# Patient Record
Sex: Male | Born: 1990 | Race: Black or African American | Hispanic: No | Marital: Single | Smoking: Never smoker
Health system: Southern US, Community
[De-identification: ages and names within clinical notes are randomized; demographics above are authoritative.]

## PROBLEM LIST (undated history)

## (undated) ENCOUNTER — Ambulatory Visit: Admission: EM | Source: Home / Self Care

## (undated) DIAGNOSIS — N4400 Torsion of testis, unspecified: Secondary | ICD-10-CM

## (undated) DIAGNOSIS — G43909 Migraine, unspecified, not intractable, without status migrainosus: Secondary | ICD-10-CM

## (undated) HISTORY — DX: Migraine, unspecified, not intractable, without status migrainosus: G43.909

## (undated) HISTORY — PX: WISDOM TOOTH EXTRACTION: SHX21

---
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
## 2014-12-12 ENCOUNTER — Ambulatory Visit: Payer: Self-pay | Admitting: Internal Medicine

## 2015-01-20 ENCOUNTER — Ambulatory Visit (INDEPENDENT_AMBULATORY_CARE_PROVIDER_SITE_OTHER): Payer: 59 | Admitting: Internal Medicine

## 2015-01-20 ENCOUNTER — Encounter: Payer: Self-pay | Admitting: Internal Medicine

## 2015-01-20 ENCOUNTER — Other Ambulatory Visit (INDEPENDENT_AMBULATORY_CARE_PROVIDER_SITE_OTHER): Payer: 59

## 2015-01-20 VITALS — BP 122/70 | HR 86 | Temp 98.3°F | Resp 16 | Ht 69.0 in | Wt 149.8 lb

## 2015-01-20 DIAGNOSIS — Z0000 Encounter for general adult medical examination without abnormal findings: Secondary | ICD-10-CM | POA: Diagnosis not present

## 2015-01-20 LAB — CBC
HCT: 42.8 % (ref 39.0–52.0)
Hemoglobin: 14.6 g/dL (ref 13.0–17.0)
MCHC: 34.2 g/dL (ref 30.0–36.0)
MCV: 86.2 fl (ref 78.0–100.0)
Platelets: 229 10*3/uL (ref 150.0–400.0)
RBC: 4.96 Mil/uL (ref 4.22–5.81)
RDW: 13.4 % (ref 11.5–15.5)
WBC: 3.8 10*3/uL — ABNORMAL LOW (ref 4.0–10.5)

## 2015-01-20 LAB — BASIC METABOLIC PANEL
BUN: 10 mg/dL (ref 6–23)
CO2: 33 mEq/L — ABNORMAL HIGH (ref 19–32)
Calcium: 10 mg/dL (ref 8.4–10.5)
Chloride: 101 mEq/L (ref 96–112)
Creatinine, Ser: 1.02 mg/dL (ref 0.40–1.50)
GFR: 118.07 mL/min (ref >60.00)
Glucose, Bld: 72 mg/dL (ref 70–99)
Potassium: 3.7 mEq/L (ref 3.5–5.1)
Sodium: 138 mEq/L (ref 135–145)

## 2015-01-20 LAB — TSH: TSH: 2.13 u[IU]/mL (ref 0.35–4.50)

## 2015-01-20 NOTE — Progress Notes (Signed)
Pre visit review using our clinic review tool, if applicable. No additional management support is needed unless otherwise documented below in the visit note. 

## 2015-01-20 NOTE — Patient Instructions (Signed)
We will check on the blood work today to make sure that there are no problems.   Keep up the good work at being healthy and remember to exercise 3 times a week.   Be sure to use protection every time to decrease your risk of STDs. Great job staying away from tobacco keep up the good work with that.   Come back in 1-2 years for a check up and if you have any problems or questions please feel free to call the office.   Health Maintenance - 49-22 Years Old SCHOOL PERFORMANCE After high school, you may attend college or technical or vocational school, enroll in the [employer], or enter the workforce. PHYSICAL, SOCIAL, AND EMOTIONAL DEVELOPMENT  One hour of regular physical activity daily is recommended. Continue to participate in sports.  Develop your own interests and consider community service or volunteerism.  Make decisions about college and work plans.  Throughout these years, you should assume responsibility for your own health care. Increasing independence is important for you.  You may be exploring your sexual identity. Understand that you should never be in a situation that makes you feel uncomfortable, and tell your partner if you do not want to engage in sexual activity.  Body image may become important to you. Be mindful that eating disorders can develop at this time. Talk to your parents or other caregivers if you have concerns about body image, weight gain, or losing weight.  You may notice mood disturbances, depression, anxiety, attention problems, or trouble with alcohol. Talk to your health care provider if you have concerns about mental illness.  Set limits for yourself and talk with your parents or other caregivers about independent decision making.  Handle conflict without physical violence.  Avoid loud noises which may impair hearing.  Limit television and computer time to 2 hours each day. Individuals who engage in excessive inactivity are more likely to become  overweight. RECOMMENDED IMMUNIZATIONS  Influenza vaccine.  All adults should be immunized every year.  All adults, including pregnant women and people with hives-only allergy to eggs, can receive the inactivated influenza (IIV) vaccine.  Adults aged 18-49 years can receive the recombinant influenza (RIV) vaccine. The RIV vaccine does not contain any egg protein.  Tetanus, diphtheria, and acellular pertussis (Td, Tdap) vaccine.  Pregnant women should receive 1 dose of Tdap vaccine during each pregnancy. The dose should be obtained regardless of the length of time since the last dose. Immunization is preferred during the 27th to 36th week of gestation.  An adult who has not previously received Tdap or who does not know his or her vaccine status should receive 1 dose of Tdap. This initial dose should be followed by tetanus and diphtheria toxoids (Td) booster doses every 10 years.  Adults with an unknown or incomplete history of completing a 3-dose immunization series with Td-containing vaccines should begin or complete a primary immunization series including a Tdap dose.  Adults should receive a Td booster every 10 years.  Varicella vaccine.  An adult without evidence of immunity to varicella should receive 2 doses or a second dose if he or she has previously received 1 dose.  Pregnant females who do not have evidence of immunity should receive the first dose after pregnancy. This first dose should be obtained before leaving the health care facility. The second dose should be obtained 4-8 weeks after the first dose.  Human papillomavirus (HPV) vaccine.  Females aged 13-26 years who have not received the vaccine  previously should obtain the 3-dose series.  The vaccine is not recommended for pregnant females. However, pregnancy testing is not needed before receiving a dose. If a male is found to be pregnant after receiving a dose, no treatment is needed. In that case, the remaining doses  should be delayed until after the pregnancy.  Males aged 19-21 years who have not received the vaccine previously should receive the 3-dose series. Males aged 22-26 years may be immunized.  Immunization is recommended through the age of 22 years for any male who has sex with males and did not get any or all doses earlier.  Immunization is recommended for any person with an immunocompromised condition through the age of 43 years if he or she did not get any or all doses earlier.  During the 3-dose series, the second dose should be obtained 4-8 weeks after the first dose. The third dose should be obtained 24 weeks after the first dose and 16 weeks after the second dose.  Measles, mumps, and rubella (MMR) vaccine.  Adults born in [birth year] or later should have 1 or more doses of MMR vaccine unless there is a contraindication to the vaccine or there is laboratory evidence of immunity to each of the three diseases.  A routine second dose of MMR vaccine should be obtained at least 28 days after the first dose for students attending postsecondary schools, health care workers, and international travelers.  For females of childbearing age, rubella immunity should be determined. If there is no evidence of immunity, females who are not pregnant should be vaccinated. If there is no evidence of immunity, females who are pregnant should delay immunization until after pregnancy.  Pneumococcal 13-valent conjugate (PCV13) vaccine.  When indicated, a person who is uncertain of his or her immunization history and has no record of immunization should receive the PCV13 vaccine.  An adult aged 61 years or older who has certain medical conditions and has not been previously immunized should receive 1 dose of PCV13 vaccine. This PCV13 should be followed with a dose of pneumococcal polysaccharide (PPSV23) vaccine. The PPSV23 vaccine dose should be obtained at least 8 weeks after the dose of PCV13 vaccine.  An adult aged  67 years or older who has certain medical conditions and previously received 1 or more doses of PPSV23 vaccine should receive 1 dose of PCV13. The PCV13 vaccine dose should be obtained 1 or more years after the last PPSV23 vaccine dose.  Pneumococcal polysaccharide (PPSV23) vaccine.  When PCV13 is also indicated, PCV13 should be obtained first.  An adult younger than age 58 years who has certain medical conditions should be immunized.  Any person who resides in a long-term care facility should be immunized.  An adult smoker should be immunized.  People with an immunocompromised condition and certain other conditions should receive both PCV13 and PPSV23 vaccines.  People with human immunodeficiency virus (HIV) infection should be immunized as soon as possible after diagnosis.  Immunization during chemotherapy or radiation therapy should be avoided.  Routine use of PPSV23 vaccine is not recommended for American Indians, [place] Natives, or people younger than 65 years unless there are medical conditions that require PPSV23 vaccine.  When indicated, people who have unknown immunization and have no record of immunization should receive PPSV23 vaccine.  One-time revaccination 5 years after the first dose of PPSV23 is recommended for people aged 19-64 years who have chronic kidney failure, nephrotic syndrome, asplenia, or immunocompromised conditions.  Meningococcal vaccine.  Adults with  asplenia or persistent complement component deficiencies should receive 2 doses of quadrivalent meningococcal conjugate (MenACWY-D) vaccine. The doses should be obtained at least 2 months apart.  Microbiologists working with certain meningococcal bacteria, [employer] recruits, people at risk during an outbreak, and people who travel to or live in countries with a high rate of meningitis should be immunized.  A first-year college student up through age 35 years who is living in a residence hall should receive a  dose if he or she did not receive a dose on or after his or her 16th birthday.  Adults who have certain high-risk conditions should receive one or more doses of vaccine.  Hepatitis A vaccine.  Adults who wish to be protected from this disease, have certain high-risk conditions, work with hepatitis A-infected animals, work in hepatitis A research labs, or travel to or work in countries with a high rate of hepatitis A should be immunized.  Adults who were previously unvaccinated and who anticipate close contact with an international adoptee during the first 60 days after arrival in the [place] from a country with a high rate of hepatitis A should be immunized.  Hepatitis B vaccine.  Adults who wish to be protected from this disease, have certain high-risk conditions, may be exposed to blood or other infectious body fluids, are household contacts or sex partners of hepatitis B positive people, are clients or workers in certain care facilities, or travel to or work in countries with a high rate of hepatitis B should be immunized.  Haemophilus influenzae type b (Hib) vaccine.  A previously unvaccinated person with asplenia or sickle cell disease or having a scheduled splenectomy should receive 1 dose of Hib vaccine.  Regardless of previous immunization, a recipient of a hematopoietic stem cell transplant should receive a 3-dose series 6-12 months after his or her successful transplant.  Hib vaccine is not recommended for adults with HIV infection. TESTING  Annual screening for vision and hearing problems is recommended. Vision should be screened at least once between 45-48 years of age.  You may be screened for anemia or tuberculosis.  You should have a blood test to check for high cholesterol.  You should be screened for alcohol and drug use.  If you are sexually active, you may be screened for sexually transmitted infections (STIs), pregnancy, or HIV. You should be screened for STIs  if:  Your sexual activity has changed since the last screening test, and you are at an increased risk for chlamydia or gonorrhea. Ask your health care provider if you are at risk.  If you are at an increased risk for hepatitis B, you should be screened for this virus. You are considered at high risk for hepatitis B if you:  Were born in a country where hepatitis B occurs often. Talk with your health care provider about which countries are considered high risk.  Have parents who were born in a high-risk country and have not received a shot to protect against hepatitis B (hepatitis B vaccine).  Have HIV or AIDS.  Use needles to inject street drugs.  Live with or have sex with someone who has hepatitis B.  Are a man who has sex with other men (MSM).  Get hemodialysis treatment.  Take certain medicines for conditions like cancer, organ transplantation, or autoimmune conditions. NUTRITION   You should:  Have three servings of low-fat milk and dairy products daily. If you do not drink milk or consume dairy products, you should eat calcium-enriched  foods, such as juice, bread, or cereal. Dark, leafy greens or canned fish are alternate sources of calcium.  Drink plenty of water. Fruit juice should be limited to 8-12 oz (240-360 mL) each day. Sugary beverages and sodas should be avoided.  Avoid eating foods high in fat, salt, or sugar, such as chips, candy, and cookies.  Avoid fast foods and limit eating out at restaurants.  Try not to skip meals, especially breakfast. You should eat a variety of vegetables, fruits, and lean meats.  Eat meals together as a family whenever possible. ORAL HEALTH Brush your teeth twice a day and floss at least once a day. You should have two dental exams a year.  SKIN CARE You should wear sunscreen when out in the sun. TALK TO SOMEONE ABOUT:  Precautions against pregnancy, contraception, and sexually transmitted infections.  Taking a prescription  medicine daily to prevent HIV infection if you are at risk of being infected with HIV. This is called preexposure prophylaxis (PrEP). You are at risk if you:  Are a male who has sex with other males (MSM).  Are heterosexual and sexually active with more than one partner.  Take drugs by injection.  Are sexually active with a partner who has HIV.  Whether you are at high risk of being infected with HIV. If you choose to begin PrEP, you should first be tested for HIV. You should then be tested every 3 months for as long as you are taking PrEP.  Drug, tobacco, and alcohol use among your friends or at friends' homes. Smoking tobacco or marijuana and taking drugs have health consequences and may impact your brain development.  Appropriate use of over-the-counter or prescription medicines.  Driving guidelines and riding with friends.  The risks of drinking and driving or boating. Call someone if you have been drinking or using drugs and need a ride. WHAT'S NEXT? Visit your pediatrician or family physician once a year. By young adulthood, you should transition from your pediatrician to a family physician or internal medicine specialist. If you are a male and are sexually active, you may want to begin annual physical exams with a gynecologist. Document Released: 12/29/2006 Document Revised: 10/08/2013 Document Reviewed: 01/18/2007 [facility] Patient Information 2015 [place], [place]. This information is not intended to replace advice given to you by your health care provider. Make sure you discuss any questions you have with your health care provider.

## 2015-01-22 DIAGNOSIS — Z0000 Encounter for general adult medical examination without abnormal findings: Secondary | ICD-10-CM | POA: Insufficient documentation

## 2015-01-22 NOTE — Assessment & Plan Note (Signed)
Routine screening labs, recently had STD screening so will not duplicate. Talked with him about the need for safe driving and limiting drinking. Talked to him about the dangers of binge drinking as well as drinking and driving. He is aware. He is a non-smoker. Talked with him about sun safety. He does have a small spot of darker skin on his lip and advised him that it is likely nothing but he wishes to go to dermatologist which is fine. No indication at this time for early colon cancer screening. Has had tetanus shot. Talked to him about the need for protection with sexual activity to prevent against STDs.

## 2015-01-22 NOTE — Progress Notes (Signed)
   Subjective:    Patient ID: Paul Beasley, male    DOB: Oct 24, 1990, 24 y.o.   MRN: [id number]  HPI The patient is a new 24 YO man coming in for wellness. Denies smoking, drinking alcohol frequently. He uses protection and has regular STD screening including recent HIV test. Tries to exercise 3-4 times per week. Denies medical problems or complaints.   [facility], social history reviewed and updated.   Review of Systems  Constitutional: Negative for fever, activity change, appetite change, fatigue and unexpected weight change.  HENT: Negative.   Eyes: Negative.   Respiratory: Negative for cough, chest tightness, shortness of breath and wheezing.   Cardiovascular: Negative for chest pain, palpitations and leg swelling.  Gastrointestinal: Negative for abdominal pain, diarrhea, constipation and abdominal distention.  Musculoskeletal: Negative.   Neurological: Negative.   Psychiatric/Behavioral: Negative.       Objective:   Physical Exam  Constitutional: He is oriented to person, place, and time. He appears well-developed and well-nourished.  HENT:  Head: Normocephalic and atraumatic.  Eyes: EOM are normal.  Neck: Normal range of motion.  Cardiovascular: Normal rate and regular rhythm.   Pulmonary/Chest: Effort normal and breath sounds normal. No respiratory distress. He has no wheezes. He has no rales.  Abdominal: Soft. Bowel sounds are normal. He exhibits no distension. There is no tenderness. There is no rebound.  Neurological: He is alert and oriented to person, place, and time. Coordination normal.  Skin: Skin is warm and dry.   Filed Vitals:   01/20/15 1508  BP: 122/70  Pulse: 86  Temp: 98.3 F (36.8 C)  TempSrc: Oral  Resp: 16  Height: 5\' 9"  (1.753 m)  Weight: 149 lb 12.8 oz (67.949 kg)  SpO2: 97%      Assessment & Plan:

## 2015-11-21 ENCOUNTER — Encounter: Payer: Self-pay | Admitting: Family Medicine

## 2015-11-21 ENCOUNTER — Ambulatory Visit (INDEPENDENT_AMBULATORY_CARE_PROVIDER_SITE_OTHER): Payer: BLUE CROSS/BLUE SHIELD | Admitting: Family Medicine

## 2015-11-21 VITALS — BP 130/100 | HR 91 | Temp 98.7°F | Ht 69.0 in | Wt 141.0 lb

## 2015-11-21 DIAGNOSIS — J069 Acute upper respiratory infection, unspecified: Secondary | ICD-10-CM | POA: Diagnosis not present

## 2015-11-21 MED ORDER — BENZONATATE 200 MG PO CAPS: 200.0000 mg | ORAL_CAPSULE | Freq: Three times a day (TID) | ORAL | Status: DC | PRN

## 2015-11-21 NOTE — Assessment & Plan Note (Signed)
Likely viral, not clinically typical for flu.  Well appearing.   Flu testing wouldn't change mgmt at this point, given duration and mild sx.  Okay for outpatient f/u. Tessalon prn cough.  See avs.   D/w pt he agrees.

## 2015-11-21 NOTE — Patient Instructions (Signed)
Use tessalon for the cough.  Drink plenty of fluids, take tylenol as needed, and gargle with warm salt water for your throat if needed.  This should gradually improve.  Take care.  Let us know if you have other concerns.

## 2015-11-21 NOTE — Progress Notes (Signed)
Pre visit review using our clinic review tool, if applicable. No additional management support is needed unless otherwise documented below in the visit note.  Sx started about 4 days ago.  ST initially.  Still with ST.  Some nausea.   Some cough, chest sore from coughing, more cough recently.  Some sputum, clear. Fevers prev, better with tylenol.  No ear pain, some rhinorrhea.  Some cold sweats.  Sick contacts noted, sister.  No vomiting, no diarrhea.  Some mild aches, no rash.  Didn't recall tmax on home check.  Didn't have a flu shot.    Meds, vitals, and allergies reviewed.   ROS: See HPI.  Otherwise, noncontributory.  GEN: nad, alert and oriented HEENT: mucous membranes moist, tm w/o erythema, nasal exam w/o erythema, clear discharge noted,  OP with cobblestoning NECK: supple w/o LA CV: rrr.   PULM: ctab, no inc wob EXT: no edema

## 2016-06-21 ENCOUNTER — Telehealth: Payer: Self-pay | Admitting: Internal Medicine

## 2016-06-21 ENCOUNTER — Encounter: Payer: Self-pay | Admitting: Internal Medicine

## 2016-06-21 ENCOUNTER — Ambulatory Visit (INDEPENDENT_AMBULATORY_CARE_PROVIDER_SITE_OTHER): Payer: BLUE CROSS/BLUE SHIELD | Admitting: Internal Medicine

## 2016-06-21 DIAGNOSIS — B360 Pityriasis versicolor: Secondary | ICD-10-CM

## 2016-06-21 DIAGNOSIS — N50812 Left testicular pain: Secondary | ICD-10-CM | POA: Diagnosis not present

## 2016-06-21 MED ORDER — TRAMADOL HCL 50 MG PO TABS: 50.0000 mg | ORAL_TABLET | Freq: Three times a day (TID) | ORAL | 0 refills | Status: DC | PRN

## 2016-06-21 MED ORDER — KETOCONAZOLE 200 MG PO TABS: 200.0000 mg | ORAL_TABLET | Freq: Every day | ORAL | 0 refills | Status: DC

## 2016-06-21 MED ORDER — IBUPROFEN 600 MG PO TABS
600.0000 mg | ORAL_TABLET | Freq: Four times a day (QID) | ORAL | 0 refills | Status: DC | PRN
Start: 2016-06-21 — End: 2016-07-05

## 2016-06-21 MED ORDER — KETOROLAC TROMETHAMINE 60 MG/2ML IM SOLN
60.0000 mg | Freq: Once | INTRAMUSCULAR | Status: AC
  Administered 2016-06-21: 60 mg via INTRAMUSCULAR

## 2016-06-21 MED ORDER — CIPROFLOXACIN HCL 500 MG PO TABS: 500.0000 mg | ORAL_TABLET | Freq: Two times a day (BID) | ORAL | 0 refills | Status: DC

## 2016-06-21 MED ORDER — CEFTRIAXONE SODIUM 1 G IJ SOLR
1.0000 g | Freq: Once | INTRAMUSCULAR | Status: AC
  Administered 2016-06-21: 1 g via INTRAMUSCULAR

## 2016-06-21 NOTE — Progress Notes (Signed)
Pre visit review using our clinic review tool, if applicable. No additional management support is needed unless otherwise documented below in the visit note. 

## 2016-06-21 NOTE — Assessment & Plan Note (Signed)
Epididimitis. R/o torsion Cipro Toradol IM Tramadol #12 Ibuprofen 600 mg prn US STAT

## 2016-06-21 NOTE — Addendum Note (Signed)
Addended by: Merrilyn PumaSIMMONS, Madiline Saffran N on: 06/21/2016 04:46 PM   Modules accepted: Orders

## 2016-06-21 NOTE — Assessment & Plan Note (Signed)
Ketoconazole po 

## 2016-06-21 NOTE — Telephone Encounter (Signed)
FYI - [facility] couldn't see him till Fri for his US and the hospital could get him in tomorrow.  Pt wanted the appointment on Friday

## 2016-06-21 NOTE — Addendum Note (Signed)
Addended by: Merrilyn PumaSIMMONS, Lenita Peregrina N on: 06/21/2016 02:22 PM   Modules accepted: Orders

## 2016-06-21 NOTE — Telephone Encounter (Signed)
Noted Pls tell the pt to go to ER if the pain is worse or if the testis "pulls up" Thx

## 2016-06-21 NOTE — Progress Notes (Signed)
Subjective:  Patient ID: Paul Beasley, male    DOB: 04-Mar-1991  Age: 25 y.o. MRN: [id number]  CC: Testicle Pain (since waking up this morning. The pain is radiating into lower abd. No known injury) and Emesis (this morning)   HPI Paul StandsWilliam Beasley presents for a L testicle pain that started this am - 7/10. It felt like he was kicked in the balls. There was no "pulling up of the testicle". He took Ibuprofen - it did not help. Not sexually active. Vomited twice this am C/o back skin rash x 1 year      Outpatient Medications Prior to Visit  Medication Sig Dispense Refill  . benzonatate (TESSALON) 200 MG capsule Take 1 capsule (200 mg total) by mouth 3 (three) times daily as needed. 30 capsule 1   No facility-administered medications prior to visit.     ROS Review of Systems  Constitutional: Positive for fever.  Genitourinary: Negative for decreased urine volume and dysuria.    Objective:  BP 132/78   Pulse 87   Temp 99 F (37.2 C) (Oral)   Wt 145 lb (65.8 kg)   SpO2 98%   BMI 21.41 kg/m   BP Readings from Last 3 Encounters:  06/21/16 132/78  11/21/15 (!) 130/100  01/20/15 122/70    Wt Readings from Last 3 Encounters:  06/21/16 145 lb (65.8 kg)  11/21/15 141 lb (64 kg)  01/20/15 149 lb 12.8 oz (67.9 kg)    Physical Exam  Constitutional: He is oriented to person, place, and time. He appears well-developed. No distress.  NAD  HENT:  Mouth/Throat: Oropharynx is clear and moist.  Eyes: Conjunctivae are normal. Pupils are equal, round, and reactive to light.  Neck: Normal range of motion. No JVD present. No thyromegaly present.  Cardiovascular: Normal rate, regular rhythm, normal heart sounds and intact distal pulses.  Exam reveals no gallop and no friction rub.   No murmur heard. Pulmonary/Chest: Effort normal and breath sounds normal. No respiratory distress. He has no wheezes. He has no rales. He exhibits no tenderness.  Abdominal: Soft. Bowel sounds are normal.  He exhibits no distension and no mass. There is no tenderness. There is no rebound and no guarding.  Musculoskeletal: Normal range of motion. He exhibits no edema or tenderness.  Lymphadenopathy:    He has no cervical adenopathy.  Neurological: He is alert and oriented to person, place, and time. He has normal reflexes. No cranial nerve deficit. He exhibits normal muscle tone. He displays a negative Romberg sign. Coordination and gait normal.  Skin: Skin is warm and dry. No rash noted.  Psychiatric: He has a normal mood and affect. His behavior is normal. Judgment and thought content normal.  L epididymis is very tender and swollen; no torsion present Skin depigm on the back - macular  Lab Results  Component Value Date   WBC 3.8 (L) 01/20/2015   HGB 14.6 01/20/2015   HCT 42.8 01/20/2015   PLT 229.0 01/20/2015   GLUCOSE 72 01/20/2015   NA 138 01/20/2015   K 3.7 01/20/2015   CL 101 01/20/2015   CREATININE 1.02 01/20/2015   BUN 10 01/20/2015   CO2 33 (H) 01/20/2015   TSH 2.13 01/20/2015    Dg Nasal Bones  Result Date: 03/09/2000 FINDINGS CLINICAL DATA:     NOSE PAIN. NASAL BONES: NO EVIDENCE OF FRACTURE.  SUBOPTIMAL AERATION OF THE MAXILLARY SINUSES. IMPRESSION NO FRACTURE.   Assessment & Plan:   There are no diagnoses linked to  this encounter. I am having Mr. Rotan maintain his benzonatate.  No orders of the defined types were placed in this encounter.    Follow-up: No Follow-up on file.  Sonda Primes, MD

## 2016-06-21 NOTE — Patient Instructions (Addendum)
Epididymitis Epididymitis is swelling (inflammation) of the epididymis. The epididymis is a cord-like structure that is located along the top and back part of the testicle. It collects and stores sperm from the testicle. This condition can also cause pain and swelling of the testicle and scrotum. Symptoms usually start suddenly (acute epididymitis). Sometimes epididymitis starts gradually and lasts for a while (chronic epididymitis). This type may be harder to treat. CAUSES In men 59 and younger, this condition is usually caused by a bacterial infection or sexually transmitted disease (STD), such as:  Gonorrhea.  Chlamydia.  In men 31 and older who do not have anal sex, this condition is usually caused by bacteria from a blockage or abnormalities in the urinary system. These can result from:  Having a tube placed into the bladder (urinary catheter).  Having an enlarged or inflamed prostate gland.  Having recent urinary tract surgery. In men who have a condition that weakens the body's defense system (immune system), such as HIV, this condition can be caused by:   Other bacteria, including tuberculosis and syphilis.  Viruses.  Fungi. Sometimes this condition occurs without infection. That may happen if urine flows backward into the epididymis after heavy lifting or straining. RISK FACTORS This condition is more likely to develop in men:  Who have unprotected sex with more than one partner.  Who have anal sex.   Who have recently had surgery.   Who have a urinary catheter.  Who have urinary problems.  Who have a suppressed immune system. SYMPTOMS  This condition usually begins suddenly with chills, fever, and pain behind the scrotum and in the testicle. Other symptoms include:   Swelling of the scrotum, testicle, or both.  Pain whenejaculatingor urinating.  Pain in the back or belly.  Nausea.  Itching and discharge from the penis.  Frequent need to pass  urine.  Redness and tenderness of the scrotum. DIAGNOSIS Your health care provider can diagnose this condition based on your symptoms and medical history. Your health care provider will also do a physical exam to ask about your symptoms and check your scrotum and testicle for swelling, pain, and redness. You may also have other tests, including:   Examination of discharge from the penis.  Urine tests for infections, such as STDs.  Your health care provider may test you for other STDs, including HIV. TREATMENT Treatment for this condition depends on the cause. If your condition is caused by a bacterial infection, oral antibiotic medicine may be prescribed. If the bacterial infection has spread to your blood, you may need to receive IV antibiotics. Nonbacterial epididymitis is treated with home care that includes bed rest and elevation of the scrotum. Surgery may be needed to treat:  Bacterial epididymitis that causes pus to build up in the scrotum (abscess).  Chronic epididymitis that has not responded to other treatments. HOME CARE INSTRUCTIONS Medicines  Take over-the-counter and prescription medicines only as told by your health care provider.   If you were prescribed an antibiotic medicine, take it as told by your health care provider. Do not stop taking the antibiotic even if your condition improves. Sexual Activity  If your epididymitis was caused by an STD, avoid sexual activity until your treatment is complete.  Inform your sexual partner or partners if you test positive for an STD. They may need to be treated.Do not engage in sexual activity with your partner or partners until their treatment is completed. General Instructions  Return to your normal activities as told  by your health care provider. Ask your health care provider what activities are safe for you.  Keep your scrotum elevated and supported while resting. Ask your health care provider if you should wear a  scrotal support, such as a jockstrap. Wear it as told by your health care provider.  If directed, apply ice to the affected area:   Put ice in a plastic bag.  Place a towel between your skin and the bag.  Leave the ice on for 20 minutes, 2-3 times per day.  Try taking a sitz bath to help with discomfort. This is a warm water bath that is taken while you are sitting down. The water should only come up to your hips and should cover your buttocks. Do this 3-4 times per day or as told by your health care provider.  Keep all follow-up visits as told by your health care provider. This is important. SEEK MEDICAL CARE IF:   You have a fever.   Your pain medicine is not helping.   Your pain is getting worse.   Your symptoms do not improve within three days.   This information is not intended to replace advice given to you by your health care provider. Make sure you discuss any questions you have with your health care provider.   Document Released: 09/30/2000 Document Revised: 06/24/2015 Document Reviewed: 02/18/2015 Elsevier Interactive Patient Education 2016 [employer]   Go to ER if worse

## 2016-06-22 NOTE — Telephone Encounter (Signed)
Tried to call pt. Unable to leave msg. Will try again later

## 2016-06-22 NOTE — Telephone Encounter (Signed)
Spoke to pt and he is aware.

## 2016-06-24 ENCOUNTER — Emergency Department (HOSPITAL_COMMUNITY): Payer: BLUE CROSS/BLUE SHIELD | Admitting: Anesthesiology

## 2016-06-24 ENCOUNTER — Emergency Department (HOSPITAL_COMMUNITY)
Admission: EM | Admit: 2016-06-24 | Discharge: 2016-06-24 | Disposition: A | Discharge status: Home / Self Care | Payer: BLUE CROSS/BLUE SHIELD | Attending: Emergency Medicine | Admitting: Emergency Medicine

## 2016-06-24 ENCOUNTER — Encounter (HOSPITAL_COMMUNITY)
Admission: EM | Disposition: A | Discharge status: Home / Self Care | Payer: Self-pay | Source: Home / Self Care | Attending: Emergency Medicine

## 2016-06-24 ENCOUNTER — Ambulatory Visit
Admission: RE | Admit: 2016-06-24 | Discharge: 2016-06-24 | Disposition: A | Discharge status: Home / Self Care | Payer: BLUE CROSS/BLUE SHIELD | Source: Ambulatory Visit | Attending: Internal Medicine | Admitting: Internal Medicine

## 2016-06-24 ENCOUNTER — Encounter (HOSPITAL_COMMUNITY): Payer: Self-pay | Admitting: Emergency Medicine

## 2016-06-24 DIAGNOSIS — N501 Vascular disorders of male genital organs: Secondary | ICD-10-CM | POA: Insufficient documentation

## 2016-06-24 DIAGNOSIS — R938 Abnormal findings on diagnostic imaging of other specified body structures: Secondary | ICD-10-CM | POA: Diagnosis present

## 2016-06-24 DIAGNOSIS — Q554 Other congenital malformations of vas deferens, epididymis, seminal vesicles and prostate: Secondary | ICD-10-CM | POA: Diagnosis not present

## 2016-06-24 DIAGNOSIS — N50812 Left testicular pain: Secondary | ICD-10-CM

## 2016-06-24 DIAGNOSIS — Q5529 Other congenital malformations of testis and scrotum: Secondary | ICD-10-CM | POA: Insufficient documentation

## 2016-06-24 DIAGNOSIS — N451 Epididymitis: Secondary | ICD-10-CM

## 2016-06-24 DIAGNOSIS — N4400 Torsion of testis, unspecified: Secondary | ICD-10-CM

## 2016-06-24 HISTORY — PX: ORCHIECTOMY: SHX2116

## 2016-06-24 LAB — CBC WITH DIFFERENTIAL/PLATELET
Basophils Absolute: 0 10*3/uL (ref 0.0–0.1)
Basophils Relative: 0 %
Eosinophils Absolute: 0 10*3/uL (ref 0.0–0.7)
Eosinophils Relative: 1 %
HCT: 39.9 % (ref 39.0–52.0)
Hemoglobin: 13.3 g/dL (ref 13.0–17.0)
Lymphocytes Relative: 21 %
Lymphs Abs: 1.5 10*3/uL (ref 0.7–4.0)
MCH: 29.2 pg (ref 26.0–34.0)
MCHC: 33.3 g/dL (ref 30.0–36.0)
MCV: 87.5 fL (ref 78.0–100.0)
Monocytes Absolute: 0.6 10*3/uL (ref 0.1–1.0)
Monocytes Relative: 8 %
Neutro Abs: 4.8 10*3/uL (ref 1.7–7.7)
Neutrophils Relative %: 70 %
Platelets: 182 10*3/uL (ref 150–400)
RBC: 4.56 MIL/uL (ref 4.22–5.81)
RDW: 12.6 % (ref 11.5–15.5)
WBC: 6.8 10*3/uL (ref 4.0–10.5)

## 2016-06-24 LAB — BASIC METABOLIC PANEL
Anion gap: 9 (ref 5–15)
BUN: 16 mg/dL (ref 6–20)
CO2: 28 mmol/L (ref 22–32)
Calcium: 9.5 mg/dL (ref 8.9–10.3)
Chloride: 102 mmol/L (ref 101–111)
Creatinine, Ser: 1.08 mg/dL (ref 0.61–1.24)
GFR calc Af Amer: >60 mL/min (ref >60)
GFR calc non Af Amer: >60 mL/min (ref >60)
Glucose, Bld: 98 mg/dL (ref 65–99)
Potassium: 3.6 mmol/L (ref 3.5–5.1)
Sodium: 139 mmol/L (ref 135–145)

## 2016-06-24 SURGERY — ORCHIECTOMY
Anesthesia: General | Laterality: Bilateral

## 2016-06-24 MED ORDER — MIDAZOLAM HCL 2 MG/2ML IJ SOLN
INTRAMUSCULAR | Status: AC
  Filled 2016-06-24: qty 2

## 2016-06-24 MED ORDER — LACTATED RINGERS IV SOLN
INTRAVENOUS | Status: DC | PRN
  Administered 2016-06-24: 20:00:00 via INTRAVENOUS

## 2016-06-24 MED ORDER — KETOROLAC TROMETHAMINE 30 MG/ML IJ SOLN: 30.0000 mg | Freq: Once | INTRAMUSCULAR | Status: DC | PRN

## 2016-06-24 MED ORDER — CEFAZOLIN SODIUM-DEXTROSE 2-4 GM/100ML-% IV SOLN
INTRAVENOUS | Status: AC
  Filled 2016-06-24: qty 100

## 2016-06-24 MED ORDER — LIDOCAINE 2% (20 MG/ML) 5 ML SYRINGE
INTRAMUSCULAR | Status: AC
Start: 2016-06-24 — End: 2016-06-24
  Filled 2016-06-24: qty 5

## 2016-06-24 MED ORDER — BUPIVACAINE HCL (PF) 0.25 % IJ SOLN
INTRAMUSCULAR | Status: AC
Start: 2016-06-24 — End: 2016-06-24
  Filled 2016-06-24: qty 30

## 2016-06-24 MED ORDER — LACTATED RINGERS IV SOLN: INTRAVENOUS | Status: DC

## 2016-06-24 MED ORDER — DEXAMETHASONE SODIUM PHOSPHATE 10 MG/ML IJ SOLN
INTRAMUSCULAR | Status: DC | PRN
  Administered 2016-06-24: 10 mg via INTRAVENOUS

## 2016-06-24 MED ORDER — PROMETHAZINE HCL 25 MG/ML IJ SOLN: 6.2500 mg | INTRAMUSCULAR | Status: DC | PRN

## 2016-06-24 MED ORDER — LIDOCAINE 2% (20 MG/ML) 5 ML SYRINGE
INTRAMUSCULAR | Status: DC | PRN
  Administered 2016-06-24: 60 mg via INTRAVENOUS

## 2016-06-24 MED ORDER — DEXAMETHASONE SODIUM PHOSPHATE 10 MG/ML IJ SOLN
INTRAMUSCULAR | Status: AC
  Filled 2016-06-24: qty 1

## 2016-06-24 MED ORDER — SODIUM CHLORIDE 0.9 % IV BOLUS (SEPSIS)
1000.0000 mL | Freq: Once | INTRAVENOUS | Status: AC
Start: 2016-06-24 — End: 2016-06-24
  Administered 2016-06-24: 1000 mL via INTRAVENOUS

## 2016-06-24 MED ORDER — FENTANYL CITRATE (PF) 100 MCG/2ML IJ SOLN
INTRAMUSCULAR | Status: DC | PRN
  Administered 2016-06-24 (×2): 50 ug via INTRAVENOUS

## 2016-06-24 MED ORDER — ONDANSETRON HCL 4 MG/2ML IJ SOLN
INTRAMUSCULAR | Status: AC
  Filled 2016-06-24: qty 2

## 2016-06-24 MED ORDER — PROPOFOL 10 MG/ML IV BOLUS
INTRAVENOUS | Status: DC | PRN
  Administered 2016-06-24: 180 mg via INTRAVENOUS

## 2016-06-24 MED ORDER — PROPOFOL 10 MG/ML IV BOLUS
INTRAVENOUS | Status: AC
  Filled 2016-06-24: qty 20

## 2016-06-24 MED ORDER — OXYCODONE HCL 5 MG/5ML PO SOLN
5.0000 mg | Freq: Once | ORAL | Status: DC | PRN
  Filled 2016-06-24: qty 5

## 2016-06-24 MED ORDER — MIDAZOLAM HCL 5 MG/5ML IJ SOLN
INTRAMUSCULAR | Status: DC | PRN
  Administered 2016-06-24: 2 mg via INTRAVENOUS

## 2016-06-24 MED ORDER — CEFAZOLIN SODIUM-DEXTROSE 2-4 GM/100ML-% IV SOLN
2.0000 g | INTRAVENOUS | Status: AC
  Administered 2016-06-24: 2 g via INTRAVENOUS
  Filled 2016-06-24: qty 100

## 2016-06-24 MED ORDER — CEFAZOLIN (ANCEF) 1 G IV SOLR: 2.0000 g | INTRAVENOUS | Status: DC

## 2016-06-24 MED ORDER — ONDANSETRON HCL 4 MG/2ML IJ SOLN
INTRAMUSCULAR | Status: DC | PRN
  Administered 2016-06-24: 4 mg via INTRAVENOUS

## 2016-06-24 MED ORDER — BUPIVACAINE HCL (PF) 0.25 % IJ SOLN
INTRAMUSCULAR | Status: DC | PRN
Start: 2016-06-24 — End: 2016-06-24
  Administered 2016-06-24: 8 mL/h

## 2016-06-24 MED ORDER — OXYCODONE HCL 5 MG PO TABS: 5.0000 mg | ORAL_TABLET | Freq: Once | ORAL | Status: DC | PRN

## 2016-06-24 MED ORDER — FENTANYL CITRATE (PF) 100 MCG/2ML IJ SOLN
INTRAMUSCULAR | Status: AC
  Filled 2016-06-24: qty 2

## 2016-06-24 MED ORDER — FENTANYL CITRATE (PF) 100 MCG/2ML IJ SOLN: 25.0000 ug | INTRAMUSCULAR | Status: DC | PRN

## 2016-06-24 SURGICAL SUPPLY — 28 items
BLADE HEX COATED 2.75 (ELECTRODE) IMPLANT
BNDG GAUZE ELAST 4 BULKY (GAUZE/BANDAGES/DRESSINGS) ×2 IMPLANT
COVER SURGICAL LIGHT HANDLE (MISCELLANEOUS) IMPLANT
DRAIN PENROSE 18X1/4 LTX STRL (WOUND CARE) ×2 IMPLANT
DRAPE LAPAROTOMY T 98X78 PEDS (DRAPES) ×2 IMPLANT
ELECT REM PT RETURN 9FT ADLT (ELECTROSURGICAL) ×2
ELECTRODE REM PT RTRN 9FT ADLT (ELECTROSURGICAL) ×1 IMPLANT
GLOVE BIOGEL M 8.0 STRL (GLOVE) ×8 IMPLANT
GOWN STRL REUS W/ TWL XL LVL3 (GOWN DISPOSABLE) ×2 IMPLANT
GOWN STRL REUS W/TWL XL LVL3 (GOWN DISPOSABLE) ×4 IMPLANT
KIT BASIN OR (CUSTOM PROCEDURE TRAY) ×2 IMPLANT
LIQUID BAND (GAUZE/BANDAGES/DRESSINGS) ×2 IMPLANT
NEEDLE HYPO 22GX1.5 SAFETY (NEEDLE) ×2 IMPLANT
NS IRRIG 1000ML POUR BTL (IV SOLUTION) ×2 IMPLANT
PACK GENERAL/GYN (CUSTOM PROCEDURE TRAY) ×2 IMPLANT
SUPPORT SCROTAL LG STRP (MISCELLANEOUS) ×2 IMPLANT
SUT CHROMIC 3 0 SH 27 (SUTURE) ×2 IMPLANT
SUT CHROMIC 4 0 SH 27 (SUTURE) IMPLANT
SUT MNCRL AB 4-0 PS2 18 (SUTURE) ×2 IMPLANT
SUT PROLENE 4 0 RB 1 (SUTURE) ×1
SUT PROLENE 4-0 RB1 .5 CRCL 36 (SUTURE) ×1 IMPLANT
SUT SILK 2 0 (SUTURE) ×1
SUT SILK 2-0 30XBRD TIE 12 (SUTURE) ×1 IMPLANT
SUT VIC AB 2-0 UR5 27 (SUTURE) IMPLANT
SUT VICRYL 0 TIES 12 18 (SUTURE) IMPLANT
SYR CONTROL 10ML LL (SYRINGE) IMPLANT
TOWEL OR 17X26 10 PK STRL BLUE (TOWEL DISPOSABLE) ×2 IMPLANT
WATER STERILE IRR 1500ML POUR (IV SOLUTION) ×2 IMPLANT

## 2016-06-24 NOTE — Transfer of Care (Signed)
Immediate Anesthesia Transfer of Care Note  Patient: Paul Beasley  Procedure(s) Performed: Procedure(s): SCROTAL EXPLORATION, RIGHT ORCHIOPEXY, LEFT ORCHIECTOMY (Bilateral)  Patient Location: PACU  Anesthesia Type:General  Level of Consciousness: awake, alert  and oriented  Airway & Oxygen Therapy: Patient Spontanous Breathing and Patient connected to face mask oxygen  Post-op Assessment: Report given to RN and Post -op Vital signs reviewed and stable  Post vital signs: Reviewed and stable  Last Vitals:  Vitals:   06/24/16 1705 06/24/16 1816  BP: 144/99 150/84  Pulse: 83 85  Resp: 16 16  Temp: 37.1 C 36.9 C    Last Pain:  Vitals:   06/24/16 1816  TempSrc: Oral  PainSc:          Complications: No apparent anesthesia complications

## 2016-06-24 NOTE — Anesthesia Preprocedure Evaluation (Signed)
Anesthesia Evaluation  Patient identified by MRN, date of birth, ID band Patient awake    Reviewed: Allergy & Precautions, NPO status , Patient's Chart, lab work & pertinent test results  Airway Mallampati: II  TM Distance: >3 FB Neck ROM: Full    Dental no notable dental hx.    Pulmonary neg pulmonary ROS,    Pulmonary exam normal breath sounds clear to auscultation       Cardiovascular negative cardio ROS Normal cardiovascular exam Rhythm:Regular Rate:Normal     Neuro/Psych negative neurological ROS  negative psych ROS   GI/Hepatic negative GI ROS, Neg liver ROS,   Endo/Other  negative endocrine ROS  Renal/GU negative Renal ROS  negative genitourinary   Musculoskeletal negative musculoskeletal ROS (+)   Abdominal   Peds negative pediatric ROS (+)  Hematology negative hematology ROS (+)   Anesthesia Other Findings   Reproductive/Obstetrics negative OB ROS                             Anesthesia Physical Anesthesia Plan  ASA: I and emergent  Anesthesia Plan: General   Post-op Pain Management:    Induction: Intravenous  Airway Management Planned: LMA and Oral ETT  Additional Equipment:   Intra-op Plan:   Post-operative Plan: Extubation in OR  Informed Consent: I have reviewed the patients History and Physical, chart, labs and discussed the procedure including the risks, benefits and alternatives for the proposed anesthesia with the patient or authorized representative who has indicated his/her understanding and acceptance.   Dental advisory given  Plan Discussed with: CRNA and Surgeon  Anesthesia Plan Comments:         Anesthesia Quick Evaluation

## 2016-06-24 NOTE — Discharge Instructions (Signed)
HOME CARE INSTRUCTIONS FOR SCROTAL PROCEDURES  Wound Care & Hygiene: You may apply an ice bag to the scrotum for the first 24 hours.  This may help decrease swelling and soreness.  You may have a dressing held in place by an athletic supporter.  You may remove the dressing in 24 hours and shower in 48 hours.  Continue to use the athletic supporter or tight briefs for at least a week.  It may be more comfortable to wear a washcloth or gauze underneath of your undershorts/athletic supporter.  This may add extra support. Activity: Rest today - not necessarily flat bed rest.  Just take it easy.  You should not do strenuous activities until your follow-up visit with your doctor.  You may resume light activity in 48 hours.  Return to Work:  Take off work for a week-it is okay to return on September 11.    Diet: Drink liquids or eat a light diet this evening.  You may resume a regular diet tomorrow.  General Expectations: You may have a small amount of bleeding.  The scrotum may be swollen or bruised for about a week.  Call your Doctor if these occur:  -persistent or heavy bleeding  -temperature of 101 degrees or more  -severe pain, not relieved by your pain medication   Post Anesthesia Home Care Instructions  Activity: Get plenty of rest for the remainder of the day. A responsible adult should stay with you for 24 hours following the procedure.  For the next 24 hours, DO NOT: -Drive a car -Advertising copywriterperate machinery -Drink alcoholic beverages -Take any medication unless instructed by your physician -Make any legal decisions or sign important papers.  Meals: Start with liquid foods such as gelatin or soup. Progress to regular foods as tolerated. Avoid greasy, spicy, heavy foods. If nausea and/or vomiting occur, drink only clear liquids until the nausea and/or vomiting subsides. Call your physician if vomiting continues.  Special Instructions/Symptoms: Your throat may feel dry or sore from  the anesthesia or the breathing tube placed in your throat during surgery. If this causes discomfort, gargle with warm salt water. The discomfort should disappear within 24 hours.  If you had a scopolamine patch placed behind your ear for the management of post- operative nausea and/or vomiting:  1. The medication in the patch is effective for 72 hours, after which it should be removed.  Wrap patch in a tissue and discard in the trash. Wash hands thoroughly with soap and water. 2. You may remove the patch earlier than 72 hours if you experience unpleasant side effects which may include dry mouth, dizziness or visual disturbances. 3. Avoid touching the patch. Wash your hands with soap and water after contact with the patch.

## 2016-06-24 NOTE — Anesthesia Procedure Notes (Signed)
Procedure Name: LMA Insertion Date/Time: 06/24/2016 8:10 PM Performed by: Enriqueta ShutterWILLIFORD, Maleya Leever D Pre-anesthesia Checklist: Patient identified, Emergency Drugs available, Suction available and Patient being monitored Patient Re-evaluated:Patient Re-evaluated prior to inductionOxygen Delivery Method: Circle system utilized Preoxygenation: Pre-oxygenation with 100% oxygen Intubation Type: IV induction Ventilation: Mask ventilation without difficulty LMA Size: 4.0 Tube type: Oral Number of attempts: 1 Placement Confirmation: positive ETCO2 and breath sounds checked- equal and bilateral Tube secured with: Tape Dental Injury: Teeth and Oropharynx as per pre-operative assessment

## 2016-06-24 NOTE — ED Notes (Signed)
Bed: [id number] Expected date:  Expected time:  Means of arrival:  Comments: Abnormal [place]

## 2016-06-24 NOTE — ED Triage Notes (Signed)
Patient states that Tuesday woke up with left lower sided/testicular pain. Patient went for ultrasound today and has no parterial flow to left side and needs urology consult.

## 2016-06-24 NOTE — ED Provider Notes (Signed)
Medical screening examination/treatment/procedure(s) were conducted as a shared visit with non-physician practitioner(s) and myself.  I personally evaluated the patient during the encounter.   EKG Interpretation None      25 year old male who presents with left groin pain. Started on Tuesday w/o trauma or exertional activity. Seen in his PCP's office and diagnosed with potential epididymitis. Started on antibiotics and scheduled for US scrotum which was performed today. US concerning for torsion and he was sent to ED. No n/v, fever, chills, dysuria, or frequency of urine. Abdomen benign. US reviewed, he has no arterial flow to left testicle and there is scrotal edema. Dr. Hillis Rangeahlstadt to consult and see. Will take to OR.   Paul Guiseana Duo Thurlow Gallaga, MD 06/24/16 [phone]

## 2016-06-24 NOTE — ED Notes (Signed)
Due to questions in consent info, informed consent to be performed in the [place]G wip[place] completed, all valuables given to mother Roma SchanzLatonya Nicholson

## 2016-06-24 NOTE — ED Provider Notes (Signed)
[facility]-EMERGENCY DEPT Provider Note   CSN: [id number] Arrival date & time: 06/24/16  1651     History   Chief Complaint Chief Complaint  Patient presents with  . abnormal ultrasound  . urology consult  . Testicle Pain    HPI  Blood pressure 144/99, pulse 83, temperature 98.8 F (37.1 C), temperature source Oral, resp. rate 16, height 5' 9.25" (1.759 m), weight 65.8 kg, SpO2 100 %.  Paul Beasley is a 25 y.o. male complaining of testicular pain, bilateral started 4 days ago, pain and lateralized to the left side, no trauma or inciting events, no dysuria, hematuria, urethral discharge. Swelling strted over the last 24 hours.  He saw his primary care doctor who ordered an ultrasound which showed no arterial flow to the left testicle, was advised to come to the ED. States his pain is moderate and exacerbated by movement and palpation, he had ibuprofen this morning with some relief, he last drank water just prior to arrival. No other past medical history.  HPI  Past Medical History:  Diagnosis Date  . Migraine     Patient Active Problem List   Diagnosis Date Noted  . Testicular pain, left 06/21/2016  . Tinea versicolor 06/21/2016  . URI (upper respiratory infection) 11/21/2015  . Routine general medical examination at a health care facility 01/22/2015    Past Surgical History:  Procedure Laterality Date  . WISDOM TOOTH EXTRACTION         Home Medications    Prior to Admission medications   Medication Sig Start Date End Date Taking? Authorizing Provider  ciprofloxacin (CIPRO) 500 MG tablet Take 1 tablet (500 mg total) by mouth 2 (two) times daily. 06/21/16  Yes Georgina Quint Plotnikov, MD  ibuprofen (ADVIL,MOTRIN) 600 MG tablet Take 1 tablet (600 mg total) by mouth every 6 (six) hours as needed for moderate pain. 06/21/16  Yes Georgina Quint Plotnikov, MD  traMADol (ULTRAM) 50 MG tablet Take 1 tablet (50 mg total) by mouth every 8 (eight) hours as needed. 06/21/16  Yes Georgina Quint  Plotnikov, MD  ketoconazole (NIZORAL) 200 MG tablet Take 1 tablet (200 mg total) by mouth daily. 06/21/16   Tresa Garter, MD    Family History Family History  Problem Relation Age of Onset  . Hypertension Mother   . Hypertension Paternal Grandmother   . Hypertension Paternal Grandfather     Social History Social History  Substance Use Topics  . Smoking status: Never Smoker  . Smokeless tobacco: Never Used  . Alcohol use No     Allergies   Review of patient's allergies indicates no known allergies.   Review of Systems Review of Systems  10 systems reviewed and found to be negative, except as noted in the HPI.  Physical Exam Updated Vital Signs BP 150/84 (BP Location: Right Arm)   Pulse 85   Temp 98.5 F (36.9 C) (Oral)   Resp 16   Ht 5' 9.25" (1.759 m)   Wt 65.8 kg   SpO2 96%   BMI 21.26 kg/m   Physical Exam  Constitutional: He is oriented to person, place, and time. He appears well-developed and well-nourished. No distress.  HENT:  Head: Normocephalic and atraumatic.  Mouth/Throat: Oropharynx is clear and moist.  Eyes: Conjunctivae and EOM are normal. Pupils are equal, round, and reactive to light.  Neck: Normal range of motion.  Cardiovascular: Normal rate, regular rhythm and intact distal pulses.   Pulmonary/Chest: Effort normal and breath sounds normal.  Abdominal: Soft.  There is no tenderness.  Genitourinary:  Genitourinary Comments: Left testicle is diffusely edematous and tender to palpation. Prehn sign negative.  Musculoskeletal: Normal range of motion.  Neurological: He is alert and oriented to person, place, and time.  Skin: He is not diaphoretic.  Psychiatric: He has a normal mood and affect.  Nursing note and vitals reviewed.    ED Treatments / Results  Labs  CRITICAL CARE Performed by: Wynetta Emery   Total critical care time: 35 minutes  Critical care time was exclusive of separately billable procedures and treating other  patients.  Critical care was necessary to treat or prevent imminent or life-threatening deterioration.  Critical care was time spent personally by me on the following activities: development of treatment plan with patient and/or surrogate as well as nursing, discussions with consultants, evaluation of patient's response to treatment, examination of patient, obtaining history from patient or surrogate, ordering and performing treatments and interventions, ordering and review of laboratory studies, ordering and review of radiographic studies, pulse oximetry and re-evaluation of patient's condition.  (all labs ordered are listed, but only abnormal results are displayed) Labs Reviewed  CBC WITH DIFFERENTIAL/PLATELET  BASIC METABOLIC PANEL    EKG  EKG Interpretation None       Radiology US Scrotum  Result Date: 06/24/2016 CLINICAL DATA:  Acute left testicular pain. EXAM: SCROTAL ULTRASOUND DOPPLER ULTRASOUND OF THE TESTICLES TECHNIQUE: Complete ultrasound examination of the testicles, epididymis, and other scrotal structures was performed. Color and spectral Doppler ultrasound were also utilized to evaluate blood flow to the testicles. COMPARISON:  None. FINDINGS: Right testicle Measurements: 4.9 x 2.9 x 2.5 cm. No mass or microlithiasis visualized. Left testicle Measurements: 4.6 x 3.4 x 2.9 cm. The testicle is heterogeneous in appearance. Decreased venous flow is noted on Doppler. No definite arterial flow is noted. Right epididymis:  Normal in size and appearance. Left epididymis: Enlarged with increased flow suggesting epididymitis. Hydrocele:  Minimal left hydrocele is noted. Varicocele:  None visualized. Pulsed Doppler interrogation of both testes demonstrates normal low resistance arterial and venous waveforms of the right testicle. Decreased venous flow is noted in the left, with no definite arterial flow seen in left testicle. Scrotal wall thickening is noted. IMPRESSION: Left testicle  appears to be hypoechoic and heterogeneous in appearance, suggesting possible edema, with no definite arterial flow noted on Doppler. This is concerning for testicular torsion. Left epididymis appears to be enlarged also suggesting possible epididymitis. Critical Value/emergent results were called by telephone at the time of interpretation on 06/24/2016 at 3:49 pm to Dr. Jacinta Shoe , who verbally acknowledged these results. Electronically Signed   By: Lupita Raider, M.D.   On: 06/24/2016 15:50   [place] Art/ven Flow Abd Pelv Doppler  Result Date: 06/24/2016 CLINICAL DATA:  Acute left testicular pain. EXAM: SCROTAL ULTRASOUND DOPPLER ULTRASOUND OF THE TESTICLES TECHNIQUE: Complete ultrasound examination of the testicles, epididymis, and other scrotal structures was performed. Color and spectral Doppler ultrasound were also utilized to evaluate blood flow to the testicles. COMPARISON:  None. FINDINGS: Right testicle Measurements: 4.9 x 2.9 x 2.5 cm. No mass or microlithiasis visualized. Left testicle Measurements: 4.6 x 3.4 x 2.9 cm. The testicle is heterogeneous in appearance. Decreased venous flow is noted on Doppler. No definite arterial flow is noted. Right epididymis:  Normal in size and appearance. Left epididymis: Enlarged with increased flow suggesting epididymitis. Hydrocele:  Minimal left hydrocele is noted. Varicocele:  None visualized. Pulsed Doppler interrogation of both testes demonstrates normal low resistance  arterial and venous waveforms of the right testicle. Decreased venous flow is noted in the left, with no definite arterial flow seen in left testicle. Scrotal wall thickening is noted. IMPRESSION: Left testicle appears to be hypoechoic and heterogeneous in appearance, suggesting possible edema, with no definite arterial flow noted on Doppler. This is concerning for testicular torsion. Left epididymis appears to be enlarged also suggesting possible epididymitis. Critical Value/emergent results  were called by telephone at the time of interpretation on 06/24/2016 at 3:49 pm to Dr. Jacinta ShoeALEKSEI PLOTNIKOV , who verbally acknowledged these results. Electronically Signed   By: Lupita RaiderJames  Green Jr, M.D.   On: 06/24/2016 15:50    Procedures Procedures (including critical care time)  Medications Ordered in ED Medications  sodium chloride 0.9 % bolus 1,000 mL (1,000 mLs Intravenous New Bag/Given 06/24/16 1727)     Initial Impression / Assessment and Plan / ED Course  I have reviewed the triage vital signs and the nursing notes.  Pertinent labs & imaging results that were available during my care of the patient were reviewed by me and considered in my medical decision making (see chart for details).  Clinical Course    Vitals:   06/24/16 1701 06/24/16 1705 06/24/16 1816  BP:  144/99 150/84  Pulse:  83 85  Resp:  16 16  Temp:  98.8 F (37.1 C) 98.5 F (36.9 C)  TempSrc:  Oral Oral  SpO2:  100% 96%  Weight: 65.8 kg    Height: 5' 9.25" (1.759 m)      Medications  sodium chloride 0.9 % bolus 1,000 mL (1,000 mLs Intravenous New Bag/Given 06/24/16 1727)    Lunette StandsWilliam Manzano is 25 y.o. male presenting with Testicular pain onset several days ago with associated swelling onset yesterday. Ultrasound shows a left testicular epididymitis with no arterial flow to the left testes. Likely torsed recently. Case discussed with urologist Dr. Archie Balboaahlstet we'll taken to the operating room. Patient advised to remain nothing by mouth.     Final Clinical Impressions(s) / ED Diagnoses   Final diagnoses:  Testicular torsion  Epididymitis     Wynetta Emeryicole Leeta Grimme, PA-C 06/24/16 1911    Lavera Guiseana Duo Liu, MD 06/24/16 1940

## 2016-06-24 NOTE — Op Note (Signed)
Preoperative diagnosis: Delayed left testicular torsion.  Postoperative diagnosis: Same  Principal procedure: Scrotal exploration, left orchiectomy, right orchidopexy.  Surgeon: Retta Dionesahlstedt  Anesthesia: Gen.  Complications: None.  Estimated blood loss: Less than 50 mL  Specimen: Left testicle, to pathology  Findings: The left testicle was without torsion, but was gangrenous.  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  This was initially treated as epididymitis.  Patient had persistent pain and underwent a scrotal ultrasound today revealing no blood flow in his left testicle, findings strongly suspicious for torsion.  He presents at this time for scrotal exploration, possible orchiectomy on the left versus orchidopexy, right orchidopexy.  The procedure as well as risks and complications have been discussed with the patient and his family.  The understand and desire to proceed.  Description of procedure: The patient was properly identified and marked in the holding area.  He received preoperative IV Ancef.  He was taken the operating room where general anesthetic was administered.Marland Kitchen.  He is placed in the dorsolithotomy position.  Genitalia and perineum were prepped and draped.  Proper timeout was performed.  A midline incision was carried out in the median raphae of the anterior scrotum.  There was significant scrotal wall edema.  It was carried down to the left tunica albuginea.  It was obvious, through the tunica albuginea, that the testicle had been infarcted.  Once the tunica albuginea was incised, the testicle was delivered.  There was no obvious torsion of the cord.  However, the cord was thrombosed as well.  It was obvious that an orchiectomy needed to be carried out.  Kelly clamps were used to dissected cord into 2 bundles, with each bundle clamped.  The spermatic cord distal to the clamps was cut.  10 mL  of quarter percent Marcaine was then used to infiltrate the left spermatic cord.  The cord bundles were ligated each with 2 separate 0 silk ties.  After removal of the clamps, there was adequate hemostasis.  I reviewed the cord stump for some time, with no bleeding seen.  Small bleeders within the left hemiscrotum were coagulated.  At this point, dissection was carried down to the right tunica vaginalis using the electrocautery.  The tunica albuginea was then encountered.  Following incision of the tunica vaginalis.  There was both an appendix testis and appendix epididymis.  Both of these were cauterized.  The testicle and epididymal structures appeared normal.  4-0 Prolene was then used to fix the tunica albuginea to the dartos fascia in 3 separate areas.  This completed the orchidopexy.  I then closed the dartos fascia with a running 3-0 chromic.  Skin edges were then reapproximated with a running subcuticular suture of 4-0 Monocryl.  No drain was left.  Dermabond was placed over top of the incision.  Gauze sponges were placed underneath the compressive undergarment.  The testicle and cord were sent for pathology.  The patient tolerated procedure well.  He was taken to the PACU in stable condition.  Sponge needle and enhancement counts were correct 2.

## 2016-06-24 NOTE — Telephone Encounter (Signed)
Dr. Posey ReaPlotnikov received call report on STAT ultrasound done today.  See results in chart. He wants pt informed to [facility] now due to findings. I called [facility] at [phone]. I spoke to pt while he was still there. He states he can be there shortly after 5 pm.    I [facility] and advised pt will be there and needs to see Urologist today per PCP to r/o possible testicular torsion.

## 2016-06-24 NOTE — H&P (Signed)
Urology History and Physical Exam  CC: Left testicular pain  HPI: 25 year old male , college student [employer], presents with a three-day history of left testicular pain.  This was fairly sudden on onset, this Tuesday.  He went to his PCPs office where epididymitis was suspected.  He was started on antibiotics and pain medicine.  With his persistent pain, he underwent ultrasound of his testicles today.  This revealed a normal right testicle that no blood flow of his left testicle.  There is been significant swelling of his left hemiscrotum.  He has persistent pain.  There is a history of  occasional herald episodes of pain in his left testicle.  These were very short-lived.  He denies any right-sided testicular pain.  PMH: Past Medical History:  Diagnosis Date  . Migraine     PSH: Past Surgical History:  Procedure Laterality Date  . WISDOM TOOTH EXTRACTION      Allergies: No Known Allergies  Medications:  (Not in a hospital admission)   Social History: Social History   Social History  . Marital status: Single    Spouse name: N/A  . Number of children: N/A  . Years of education: N/A   Occupational History  . Not on file.   Social History Main Topics  . Smoking status: Never Smoker  . Smokeless tobacco: Never Used  . Alcohol use No  . Drug use: No  . Sexual activity: Not on file   Other Topics Concern  . Not on file   Social History Narrative  . No narrative on file    Family History: Family History  Problem Relation Age of Onset  . Hypertension Mother   . Hypertension Paternal Grandmother   . Hypertension Paternal Grandfather     Review of Systems: Positive: Left testicular pain, swelling Negative:   A further 10 point review of systems was negative except what is listed in the HPI.                  Physical Exam: @VITALS2 @ General: No acute distress.  Awake. Head:  Normocephalic.  Atraumatic. ENT:  EOMI.  Mucous  membranes moist Neck:  Supple.  No lymphadenopathy. CV:  S1 present. S2 present. Regular rate. Pulmonary: Equal effort bilaterally.  Clear to auscultation bilaterally. Abdomen: Soft.  Non- tender to palpation. Skin:  Normal turgor.  No visible rash. Extremity: No gross deformity of bilateral upper extremities.  No gross deformity of                             lower extremities. Neurologic: Alert. Appropriate mood.  Penis:  circumcised.  No lesions. Urethra: Orthotopic meatus. Scrotum: No lesions.  Significant left greater than right.  Scrotal edema Testicles: Descended bilaterally.  Left testicle is palpably enlarged and tender.  Right testicle is nontender.  Left testicle is high riding. .  Studies:  Recent Labs     06/24/16  1728  HGB  13.3  WBC  6.8  PLT  182    Recent Labs     06/24/16  1728  NA  139  K  3.6  CL  102  CO2  28  BUN  16  CREATININE  1.08  CALCIUM  9.5  GFRNONAA  >60  GFRAA  >60    Scrotal ultrasound images were reviewed personally and with the patient and his family.  There is no flow in the  left testicle.  Right testicle and epididymis appear normal.  There is edema of his skin No results for input(s): INR, APTT in the last 72 hours.  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  I discussed the fact that more than likely he has an infarcted, gangrenous left testicle.  Nonetheless, I think we should proceed urgently for scrotal exploration, possible left orchiectomy versus orchidopexy and right orchidopexy.  The patient has been on anti-inflammatories, so they realize that there might be an increased risk of hematoma postoperatively.  The patient and his parents agree with this strategy.  We will proceed tonight, upon anesthesia  consultation/clearance.

## 2016-06-26 NOTE — Anesthesia Postprocedure Evaluation (Signed)
Anesthesia Post Note  Patient: Paul Beasley  Procedure(s) Performed: Procedure(s) (LRB): SCROTAL EXPLORATION, RIGHT ORCHIOPEXY, LEFT ORCHIECTOMY (Bilateral)  Patient location during evaluation: PACU Anesthesia Type: General Level of consciousness: awake and alert Pain management: pain level controlled Vital Signs Assessment: post-procedure vital signs reviewed and stable Respiratory status: spontaneous breathing, nonlabored ventilation, respiratory function stable and patient connected to nasal cannula oxygen Cardiovascular status: blood pressure returned to baseline and stable Postop Assessment: no signs of nausea or vomiting Anesthetic complications: no    Last Vitals:  Vitals:   06/24/16 2200 06/24/16 2220  BP: 138/84 (!) 141/83  Pulse: 75 80  Resp: 16 16  Temp: 36.7 C 36.6 C    Last Pain:  Vitals:   06/24/16 2115  TempSrc:   PainSc: 0-No pain                 Daren Doswell S

## 2016-06-27 ENCOUNTER — Encounter (HOSPITAL_COMMUNITY): Payer: Self-pay | Admitting: Urology

## 2016-07-01 ENCOUNTER — Inpatient Hospital Stay (HOSPITAL_COMMUNITY)
Admission: EM | Admit: 2016-07-01 | Discharge: 2016-07-03 | DRG: 872 | Disposition: A | Discharge status: Home / Self Care | Payer: BLUE CROSS/BLUE SHIELD | Attending: Internal Medicine | Admitting: Internal Medicine

## 2016-07-01 ENCOUNTER — Emergency Department (HOSPITAL_COMMUNITY): Payer: BLUE CROSS/BLUE SHIELD

## 2016-07-01 DIAGNOSIS — N4400 Torsion of testis, unspecified: Secondary | ICD-10-CM | POA: Diagnosis not present

## 2016-07-01 DIAGNOSIS — Z79899 Other long term (current) drug therapy: Secondary | ICD-10-CM | POA: Diagnosis not present

## 2016-07-01 DIAGNOSIS — Z8249 Family history of ischemic heart disease and other diseases of the circulatory system: Secondary | ICD-10-CM | POA: Diagnosis not present

## 2016-07-01 DIAGNOSIS — E875 Hyperkalemia: Secondary | ICD-10-CM | POA: Diagnosis not present

## 2016-07-01 DIAGNOSIS — E872 Acidosis: Secondary | ICD-10-CM | POA: Diagnosis not present

## 2016-07-01 DIAGNOSIS — R000 Tachycardia, unspecified: Secondary | ICD-10-CM | POA: Diagnosis present

## 2016-07-01 DIAGNOSIS — N179 Acute kidney failure, unspecified: Secondary | ICD-10-CM | POA: Diagnosis present

## 2016-07-01 DIAGNOSIS — R740 Nonspecific elevation of levels of transaminase and lactic acid dehydrogenase [LDH]: Secondary | ICD-10-CM | POA: Diagnosis present

## 2016-07-01 DIAGNOSIS — A419 Sepsis, unspecified organism: Principal | ICD-10-CM | POA: Diagnosis present

## 2016-07-01 DIAGNOSIS — R509 Fever, unspecified: Secondary | ICD-10-CM | POA: Diagnosis present

## 2016-07-01 DIAGNOSIS — Z9079 Acquired absence of other genital organ(s): Secondary | ICD-10-CM

## 2016-07-01 DIAGNOSIS — R197 Diarrhea, unspecified: Secondary | ICD-10-CM | POA: Diagnosis not present

## 2016-07-01 DIAGNOSIS — Z9889 Other specified postprocedural states: Secondary | ICD-10-CM

## 2016-07-01 DIAGNOSIS — E860 Dehydration: Secondary | ICD-10-CM | POA: Diagnosis not present

## 2016-07-01 DIAGNOSIS — R0602 Shortness of breath: Secondary | ICD-10-CM | POA: Diagnosis not present

## 2016-07-01 DIAGNOSIS — R7989 Other specified abnormal findings of blood chemistry: Secondary | ICD-10-CM | POA: Diagnosis present

## 2016-07-01 HISTORY — DX: Torsion of testis, unspecified: N44.00

## 2016-07-01 LAB — CBC WITH DIFFERENTIAL/PLATELET
Basophils Absolute: 0 10*3/uL (ref 0.0–0.1)
Basophils Relative: 0 %
Eosinophils Absolute: 0 10*3/uL (ref 0.0–0.7)
Eosinophils Relative: 0 %
HCT: 38.5 % — ABNORMAL LOW (ref 39.0–52.0)
Hemoglobin: 13.4 g/dL (ref 13.0–17.0)
Lymphocytes Relative: 9 %
Lymphs Abs: 1.3 10*3/uL (ref 0.7–4.0)
MCH: 29.6 pg (ref 26.0–34.0)
MCHC: 34.8 g/dL (ref 30.0–36.0)
MCV: 85 fL (ref 78.0–100.0)
Monocytes Absolute: 1.4 10*3/uL — ABNORMAL HIGH (ref 0.1–1.0)
Monocytes Relative: 9 %
Neutro Abs: 12.5 10*3/uL — ABNORMAL HIGH (ref 1.7–7.7)
Neutrophils Relative %: 82 %
Platelets: 217 10*3/uL (ref 150–400)
RBC: 4.53 MIL/uL (ref 4.22–5.81)
RDW: 12.1 % (ref 11.5–15.5)
WBC: 15.2 10*3/uL — ABNORMAL HIGH (ref 4.0–10.5)

## 2016-07-01 LAB — BASIC METABOLIC PANEL
Anion gap: 12 (ref 5–15)
BUN: 20 mg/dL (ref 6–20)
CO2: 23 mmol/L (ref 22–32)
Calcium: 9.4 mg/dL (ref 8.9–10.3)
Chloride: 102 mmol/L (ref 101–111)
Creatinine, Ser: 1.75 mg/dL — ABNORMAL HIGH (ref 0.61–1.24)
GFR calc Af Amer: >60 mL/min (ref >60)
GFR calc non Af Amer: 54 mL/min — ABNORMAL LOW (ref >60)
Glucose, Bld: 128 mg/dL — ABNORMAL HIGH (ref 65–99)
Potassium: 3.4 mmol/L — ABNORMAL LOW (ref 3.5–5.1)
Sodium: 137 mmol/L (ref 135–145)

## 2016-07-01 LAB — I-STAT CG4 LACTIC ACID, ED: Lactic Acid, Venous: 2.23 mmol/L (ref 0.5–1.9)

## 2016-07-01 LAB — RAPID STREP SCREEN (MED CTR MEBANE ONLY): Streptococcus, Group A Screen (Direct): NEGATIVE

## 2016-07-01 MED ORDER — SODIUM CHLORIDE 0.9 % IV BOLUS (SEPSIS): 1000.0000 mL | Freq: Once | INTRAVENOUS | Status: DC

## 2016-07-01 MED ORDER — PIPERACILLIN-TAZOBACTAM 3.375 G IVPB 30 MIN
3.3750 g | Freq: Once | INTRAVENOUS | Status: AC
  Administered 2016-07-01: 3.375 g via INTRAVENOUS
  Filled 2016-07-01: qty 50

## 2016-07-01 MED ORDER — SODIUM CHLORIDE 0.9 % IV BOLUS (SEPSIS)
2000.0000 mL | Freq: Once | INTRAVENOUS | Status: AC
  Administered 2016-07-01: 2000 mL via INTRAVENOUS

## 2016-07-01 MED ORDER — ACETAMINOPHEN 325 MG PO TABS
650.0000 mg | ORAL_TABLET | Freq: Once | ORAL | Status: AC
  Administered 2016-07-01: 650 mg via ORAL
  Filled 2016-07-01: qty 2

## 2016-07-01 MED ORDER — VANCOMYCIN HCL IN DEXTROSE 1-5 GM/200ML-% IV SOLN
1000.0000 mg | Freq: Once | INTRAVENOUS | Status: AC
Start: 2016-07-01 — End: 2016-07-01
  Administered 2016-07-01: 1000 mg via INTRAVENOUS
  Filled 2016-07-01: qty 200

## 2016-07-01 NOTE — ED Triage Notes (Signed)
Pt presents with nausea/fever since this morning.  Diaphoretic.  Temp spiked to 103 at home.  [facility]  This morning awakened feeling unwell.  Treated at home with tramadol 50 and ibuprofen.

## 2016-07-01 NOTE — ED Notes (Signed)
Notified EDP,James,MD., pt. I-stat CG4 Lactic acid 2.23. And RN,Rick made aware.

## 2016-07-01 NOTE — ED Provider Notes (Signed)
[facility]-EMERGENCY DEPT Provider Note   CSN: [id number] Arrival date & time: 07/01/16  1958     History   Chief Complaint Chief Complaint  Patient presents with  . Fever    HPI Paul Beasley is a 25 y.o. male.  He presents for evaluation of fever, lightheadedness, or syncope, and hypotension.  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 He had 5 days of testicular pain last week. Unfortunate presented on day 5 here with an infarcted left testicle underwent orchiectomy. Was discharged same day, which was 6 days ago back home. His been convalescing well. Is been dizzy lightheaded last few days. Fever 103 this morning. Mom brings him in here. He presents with a pressure of 65 systolic.  HPI  Past Medical History:  Diagnosis Date  . Migraine   . Testicular torsion     Patient Active Problem List   Diagnosis Date Noted  . 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 Elevated lactic acid level 07/02/2016  . AKI (acute kidney injury) (HCC) 07/02/2016  . Sepsis (HCC) 07/02/2016  . Fever 07/02/2016  . Tinea versicolor 06/21/2016  . URI (upper respiratory infection) 11/21/2015  . Routine general medical examination at a health care facility 01/22/2015    Past Surgical History:  Procedure Laterality Date  . ORCHIECTOMY Bilateral 06/24/2016   Procedure: SCROTAL EXPLORATION, RIGHT ORCHIOPEXY, LEFT ORCHIECTOMY;  Surgeon: Marcine MatarStephen Dahlstedt, MD;  Location: [facility] ORS;  Service: Urology;  Laterality: Bilateral;  . WISDOM TOOTH EXTRACTION         Home Medications    Prior to Admission medications   Medication Sig Start Date End Date Taking? Authorizing Provider  ibuprofen (ADVIL,MOTRIN) 600 MG tablet Take 1 tablet (600 mg total) by mouth every 6 (six) hours as needed for moderate pain. 06/21/16  Yes Georgina QuintAleksei V Plotnikov, MD  ketoconazole (NIZORAL) 200 MG tablet Take 1 tablet (200 mg total) by mouth daily. 06/21/16  Yes Georgina QuintAleksei V Plotnikov, MD  traMADol (ULTRAM) 50 MG tablet Take 1 tablet (50 mg total) by  mouth every 8 (eight) hours as needed. 06/21/16  Yes Tresa GarterAleksei V Plotnikov, MD  saccharomyces boulardii (FLORASTOR) 250 MG capsule Take 1 capsule (250 mg total) by mouth 2 (two) times daily. 07/03/16   Marinda ElkAbraham Feliz Ortiz, MD  sulfamethoxazole-trimethoprim (BACTRIM DS,SEPTRA DS) 800-160 MG tablet Take 1 tablet by mouth every 12 (twelve) hours. 07/03/16   Marinda ElkAbraham Feliz Ortiz, MD    Family History Family History  Problem Relation Age of Onset  . Hypertension Mother   . Hypertension Paternal Grandmother   . Hypertension Paternal Grandfather     Social History Social History  Substance Use Topics  . Smoking status: Never Smoker  . Smokeless tobacco: Never Used  . Alcohol use No     Allergies   Review of patient's allergies indicates no known allergies.   Review of Systems Review of Systems  Constitutional: Positive for fatigue and fever. Negative for appetite change, chills and diaphoresis.  HENT: Negative for mouth sores, sore throat and trouble swallowing.        Denies sore throat. No swelling in the neck. No stiff neck.  Eyes: Negative for visual disturbance.  Respiratory: Negative for cough, chest tightness, shortness of breath and wheezing.   Cardiovascular: Negative for chest pain.  Gastrointestinal: Negative for abdominal distention, abdominal pain, diarrhea, nausea and vomiting.  Endocrine: Negative for polydipsia, polyphagia and polyuria.  Genitourinary: Negative for dysuria, frequency and hematuria.       Denies any significant discomfort  at his hemiscrotum. No drainage or bleeding from the wound.  Musculoskeletal: Negative for gait problem.  Skin: Negative for color change, pallor and rash.  Neurological: Positive for dizziness and weakness. Negative for syncope, light-headedness and headaches.  Hematological: Does not bruise/bleed easily.  Psychiatric/Behavioral: Negative for behavioral problems and confusion.     Physical Exam Updated Vital Signs BP (!) 142/75 (BP  Location: Right Arm)   Pulse 78   Temp 98.6 F (37 C) (Oral)   Resp 15   Ht 5' 9.25" (1.759 m)   Wt 148 lb 6.4 oz (67.3 kg)   SpO2 100%   BMI 21.76 kg/m   Physical Exam  Constitutional: He is oriented to person, place, and time. He appears well-developed and well-nourished. No distress.  HENT:  Head: Normocephalic.  Drink spine. Conjunctiva not pale.  Eyes: Conjunctivae are normal. Pupils are equal, round, and reactive to light. No scleral icterus.  Neck: Normal range of motion. Neck supple. No thyromegaly present.  Cardiovascular: Normal rate and regular rhythm.  Exam reveals no gallop and no friction rub.   No murmur heard. Pulmonary/Chest: Effort normal and breath sounds normal. No respiratory distress. He has no wheezes. He has no rales.  Abdominal: Soft. Bowel sounds are normal. He exhibits no distension. There is no tenderness. There is no rebound.  Genitourinary:  Genitourinary Comments: MG left hemiscrotum. Wound appears normal. No soft tissue swelling or drainage.  Musculoskeletal: Normal range of motion.  Neurological: He is alert and oriented to person, place, and time.  Skin: Skin is warm and dry. No rash noted.  Psychiatric: He has a normal mood and affect. His behavior is normal.     ED Treatments / Results  Labs (all labs ordered are listed, but only abnormal results are displayed) Labs Reviewed  CBC WITH DIFFERENTIAL/PLATELET - Abnormal; Notable for the following:       Result Value   WBC 15.2 (*)    HCT 38.5 (*)    Neutro Abs 12.5 (*)    Monocytes Absolute 1.4 (*)    All other components within normal limits  BASIC METABOLIC PANEL - Abnormal; Notable for the following:    Potassium 3.4 (*)    Glucose, Bld 128 (*)    Creatinine, Ser 1.75 (*)    GFR calc non Af Amer 54 (*)    All other components within normal limits  PROTIME-INR - Abnormal; Notable for the following:    Prothrombin Time 18.2 (*)    All other components within normal limits  BASIC  METABOLIC PANEL - Abnormal; Notable for the following:    Potassium 3.2 (*)    Calcium 8.4 (*)    All other components within normal limits  CBC - Abnormal; Notable for the following:    WBC 11.8 (*)    RBC 3.94 (*)    Hemoglobin 11.3 (*)    HCT 32.8 (*)    All other components within normal limits  I-STAT CG4 LACTIC ACID, ED - Abnormal; Notable for the following:    Lactic Acid, Venous 2.23 (*)    All other components within normal limits  RAPID STREP SCREEN (NOT AT [facility])  CULTURE, GROUP A STREP ([facility])  URINE CULTURE  CULTURE, BLOOD (ROUTINE X 2)  CULTURE, BLOOD (ROUTINE X 2)  URINALYSIS, ROUTINE W REFLEX MICROSCOPIC (NOT AT [facility])  LACTIC ACID, PLASMA  LACTIC ACID, PLASMA  PROCALCITONIN  APTT  INFLUENZA PANEL BY PCR (TYPE A & B, H1N1)  HIV ANTIBODY (ROUTINE TESTING)  EKG  EKG Interpretation  Date/Time:  Friday July 01 2016 20:14:44 EDT Ventricular Rate:  103 PR Interval:    QRS Duration: 99 QT Interval:  320 QTC Calculation: 419 R Axis:   84 Text Interpretation:  Sinus tachycardia Right atrial enlargement Consider right ventricular hypertrophy Borderline ST elevation, anterior leads RSR' or QR pattern in V1 suggests right ventricular conduction delay No old tracing to compare Confirmed by [facility]  MD, DAVID ([id number]) on 07/02/2016 12:21:21 AM       Radiology US Scrotum  Result Date: 07/02/2016 CLINICAL DATA:  Recent LEFT orchiectomy for LEFT testicular torsion. Fever EXAM: ULTRASOUND OF SCROTUM TECHNIQUE: Complete ultrasound examination of the testicles, epididymis, and other scrotal structures was performed. COMPARISON:  Ultrasound 06/24/2016 FINDINGS: Right testicle Measurements: Normal in size and homogeneous echotexture measuring 4.8 x 2.7 x 2.7 cm. Normal testicular striations present. Normal color Doppler flow. Left testicle Measurements:  Surgically absent. Right epididymis:  Normal in size and appearance. Left epididymis:  Surgically absent Hydrocele: Within  the LEFT hemiscrotum, there is a the fluid collection with thin internal septations. No significant hypervascularity or thickening. Collection measures 2.4 x 1.6 x 3.3 cm. Varicocele:  None visualized. IMPRESSION: 1. Septated fluid collection within the LEFT hemiscrotum following orchectomy without evidence of abscess formation. This is favored benign postsurgical finding. 2. Normal RIGHT testicle. Electronically Signed   By: Genevive Bi M.D.   On: 07/02/2016 09:55    Procedures Procedures (including critical care time)  Medications Ordered in ED Medications  acetaminophen (TYLENOL) tablet 650 mg (650 mg Oral Given 07/01/16 2052)  sodium chloride 0.9 % bolus 2,000 mL (0 mLs Intravenous Stopped 07/01/16 2228)  vancomycin (VANCOCIN) IVPB 1000 mg/200 mL premix (0 mg Intravenous Stopped 07/01/16 2356)  piperacillin-tazobactam (ZOSYN) IVPB 3.375 g (0 g Intravenous Stopped 07/01/16 2323)  potassium chloride SA (K-DUR,KLOR-CON) CR tablet 40 mEq (40 mEq Oral Given 07/02/16 2141)     Initial Impression / Assessment and Plan / ED Course  I have reviewed the triage vital signs and the nursing notes.  Pertinent labs & imaging results that were available during my care of the patient were reviewed by me and considered in my medical decision making (see chart for details).  Clinical Course  Value Comment By Time  Specific Gravity, Urine: 1.021 (Reviewed) Rolland Porter, MD 09/17 2352    Postoperative fever. They 6. No source on exam or evaluation. Normal chest negative strep. Elevated lactate. Mild history of present illness.  Final Clinical Impressions(s) / ED Diagnoses   Final diagnoses:  Febrile illness  Sepsis, due to unspecified organism [facility])    Discussed with hospitalist and with urology. Do not feel this is an obvious wound infection. They will see patient in consultation. One admitted to hospitalist for fluids and observation.   New Prescriptions Discharge Medication List as of  07/03/2016  5:07 PM    START taking these medications   Details  saccharomyces boulardii (FLORASTOR) 250 MG capsule Take 1 capsule (250 mg total) by mouth 2 (two) times daily., Starting Sun 07/03/2016, Normal    sulfamethoxazole-trimethoprim (BACTRIM DS,SEPTRA DS) 800-160 MG tablet Take 1 tablet by mouth every 12 (twelve) hours., Starting Sun 07/03/2016, Normal         Rolland Porter, MD 07/03/16 [phone]

## 2016-07-02 ENCOUNTER — Observation Stay (HOSPITAL_COMMUNITY): Payer: BLUE CROSS/BLUE SHIELD

## 2016-07-02 ENCOUNTER — Encounter (HOSPITAL_COMMUNITY): Payer: Self-pay | Admitting: Internal Medicine

## 2016-07-02 DIAGNOSIS — Z79899 Other long term (current) drug therapy: Secondary | ICD-10-CM | POA: Diagnosis not present

## 2016-07-02 DIAGNOSIS — R7989 Other specified abnormal findings of blood chemistry: Secondary | ICD-10-CM | POA: Diagnosis present

## 2016-07-02 DIAGNOSIS — Z9889 Other specified postprocedural states: Secondary | ICD-10-CM | POA: Diagnosis not present

## 2016-07-02 DIAGNOSIS — A419 Sepsis, unspecified organism: Secondary | ICD-10-CM | POA: Diagnosis not present

## 2016-07-02 DIAGNOSIS — R509 Fever, unspecified: Secondary | ICD-10-CM | POA: Diagnosis not present

## 2016-07-02 DIAGNOSIS — R000 Tachycardia, unspecified: Secondary | ICD-10-CM | POA: Diagnosis present

## 2016-07-02 DIAGNOSIS — Z9079 Acquired absence of other genital organ(s): Secondary | ICD-10-CM | POA: Diagnosis not present

## 2016-07-02 DIAGNOSIS — E875 Hyperkalemia: Secondary | ICD-10-CM | POA: Diagnosis present

## 2016-07-02 DIAGNOSIS — N179 Acute kidney failure, unspecified: Secondary | ICD-10-CM | POA: Diagnosis present

## 2016-07-02 DIAGNOSIS — E872 Acidosis: Secondary | ICD-10-CM | POA: Diagnosis not present

## 2016-07-02 DIAGNOSIS — R740 Nonspecific elevation of levels of transaminase and lactic acid dehydrogenase [LDH]: Secondary | ICD-10-CM | POA: Diagnosis present

## 2016-07-02 DIAGNOSIS — N4400 Torsion of testis, unspecified: Secondary | ICD-10-CM

## 2016-07-02 DIAGNOSIS — E860 Dehydration: Secondary | ICD-10-CM | POA: Diagnosis not present

## 2016-07-02 DIAGNOSIS — Z8249 Family history of ischemic heart disease and other diseases of the circulatory system: Secondary | ICD-10-CM | POA: Diagnosis not present

## 2016-07-02 DIAGNOSIS — R197 Diarrhea, unspecified: Secondary | ICD-10-CM | POA: Diagnosis not present

## 2016-07-02 LAB — URINALYSIS, ROUTINE W REFLEX MICROSCOPIC
Bilirubin Urine: NEGATIVE
Glucose, UA: NEGATIVE mg/dL
Hgb urine dipstick: NEGATIVE
Ketones, ur: NEGATIVE mg/dL
Leukocytes, UA: NEGATIVE
Nitrite: NEGATIVE
Protein, ur: NEGATIVE mg/dL
Specific Gravity, Urine: 1.021 (ref 1.005–1.030)
pH: 5 (ref 5.0–8.0)

## 2016-07-02 LAB — BASIC METABOLIC PANEL
Anion gap: 6 (ref 5–15)
BUN: 15 mg/dL (ref 6–20)
CO2: 25 mmol/L (ref 22–32)
Calcium: 8.4 mg/dL — ABNORMAL LOW (ref 8.9–10.3)
Chloride: 106 mmol/L (ref 101–111)
Creatinine, Ser: 1.22 mg/dL (ref 0.61–1.24)
GFR calc Af Amer: >60 mL/min (ref >60)
GFR calc non Af Amer: >60 mL/min (ref >60)
Glucose, Bld: 95 mg/dL (ref 65–99)
Potassium: 3.2 mmol/L — ABNORMAL LOW (ref 3.5–5.1)
Sodium: 137 mmol/L (ref 135–145)

## 2016-07-02 LAB — CBC
HCT: 32.8 % — ABNORMAL LOW (ref 39.0–52.0)
Hemoglobin: 11.3 g/dL — ABNORMAL LOW (ref 13.0–17.0)
MCH: 28.7 pg (ref 26.0–34.0)
MCHC: 34.5 g/dL (ref 30.0–36.0)
MCV: 83.2 fL (ref 78.0–100.0)
Platelets: 173 10*3/uL (ref 150–400)
RBC: 3.94 MIL/uL — ABNORMAL LOW (ref 4.22–5.81)
RDW: 12 % (ref 11.5–15.5)
WBC: 11.8 10*3/uL — ABNORMAL HIGH (ref 4.0–10.5)

## 2016-07-02 LAB — APTT: aPTT: 33 seconds (ref 24–36)

## 2016-07-02 LAB — HIV ANTIBODY (ROUTINE TESTING W REFLEX): HIV Screen 4th Generation wRfx: NONREACTIVE

## 2016-07-02 LAB — INFLUENZA PANEL BY PCR (TYPE A & B)
H1N1 flu by pcr: NOT DETECTED
Influenza A By PCR: NEGATIVE
Influenza B By PCR: NEGATIVE

## 2016-07-02 LAB — PROCALCITONIN: Procalcitonin: 1.81 ng/mL

## 2016-07-02 LAB — LACTIC ACID, PLASMA
Lactic Acid, Venous: 1.2 mmol/L (ref 0.5–1.9)
Lactic Acid, Venous: 0.7 mmol/L (ref 0.5–1.9)

## 2016-07-02 LAB — PROTIME-INR
INR: 1.49
Prothrombin Time: 18.2 seconds — ABNORMAL HIGH (ref 11.4–15.2)

## 2016-07-02 MED ORDER — ACETAMINOPHEN 650 MG RE SUPP: 650.0000 mg | Freq: Four times a day (QID) | RECTAL | Status: DC | PRN

## 2016-07-02 MED ORDER — IBUPROFEN 200 MG PO TABS
600.0000 mg | ORAL_TABLET | Freq: Four times a day (QID) | ORAL | Status: DC | PRN
  Administered 2016-07-02: 600 mg via ORAL
  Filled 2016-07-02 (×2): qty 1

## 2016-07-02 MED ORDER — ACETAMINOPHEN 325 MG PO TABS
650.0000 mg | ORAL_TABLET | Freq: Four times a day (QID) | ORAL | Status: DC | PRN
  Administered 2016-07-03: 650 mg via ORAL
  Filled 2016-07-02 (×2): qty 2

## 2016-07-02 MED ORDER — ONDANSETRON HCL 4 MG/2ML IJ SOLN: 4.0000 mg | Freq: Four times a day (QID) | INTRAMUSCULAR | Status: DC | PRN

## 2016-07-02 MED ORDER — VANCOMYCIN HCL IN DEXTROSE 750-5 MG/150ML-% IV SOLN
750.0000 mg | Freq: Two times a day (BID) | INTRAVENOUS | Status: DC
  Filled 2016-07-02: qty 150

## 2016-07-02 MED ORDER — ONDANSETRON HCL 4 MG PO TABS: 4.0000 mg | ORAL_TABLET | Freq: Four times a day (QID) | ORAL | Status: DC | PRN

## 2016-07-02 MED ORDER — SODIUM CHLORIDE 0.9 % IV SOLN: INTRAVENOUS | Status: DC

## 2016-07-02 MED ORDER — LACTATED RINGERS IV SOLN
INTRAVENOUS | Status: DC
  Administered 2016-07-02 (×2): via INTRAVENOUS

## 2016-07-02 MED ORDER — POTASSIUM CHLORIDE CRYS ER 20 MEQ PO TBCR
40.0000 meq | EXTENDED_RELEASE_TABLET | Freq: Two times a day (BID) | ORAL | Status: AC
  Administered 2016-07-02 (×2): 40 meq via ORAL
  Filled 2016-07-02 (×2): qty 2

## 2016-07-02 MED ORDER — VANCOMYCIN HCL IN DEXTROSE 750-5 MG/150ML-% IV SOLN
750.0000 mg | Freq: Three times a day (TID) | INTRAVENOUS | Status: DC
  Administered 2016-07-02 – 2016-07-03 (×3): 750 mg via INTRAVENOUS
  Filled 2016-07-02 (×4): qty 150

## 2016-07-02 MED ORDER — OSELTAMIVIR PHOSPHATE 75 MG PO CAPS
75.0000 mg | ORAL_CAPSULE | Freq: Two times a day (BID) | ORAL | Status: DC
  Administered 2016-07-02: 75 mg via ORAL
  Filled 2016-07-02: qty 1

## 2016-07-02 MED ORDER — TRAMADOL HCL 50 MG PO TABS
50.0000 mg | ORAL_TABLET | Freq: Three times a day (TID) | ORAL | Status: DC | PRN
Start: 2016-07-02 — End: 2016-07-03
  Administered 2016-07-02: 50 mg via ORAL
  Filled 2016-07-02: qty 1

## 2016-07-02 MED ORDER — PIPERACILLIN-TAZOBACTAM 3.375 G IVPB
3.3750 g | Freq: Three times a day (TID) | INTRAVENOUS | Status: DC
  Administered 2016-07-02 – 2016-07-03 (×4): 3.375 g via INTRAVENOUS
  Filled 2016-07-02 (×5): qty 50

## 2016-07-02 NOTE — Progress Notes (Signed)
Received call from Dr Radonna RickerFeliz. He plans to not do any c-diff test. Also said to d/c tamiflu and droplets precautions. Paul Beasley, [employer]

## 2016-07-02 NOTE — Consult Note (Signed)
I have been asked to see the patient by Dr. Marinda Elk, for evaluation and management of septic physiology status post orchiectomy one week prior for a testicular torsion.  History of present illness: 25 year old male who presented to the emergency department yesterday hypotensive and febrile. He is one week status post left orchiectomy for testicular torsion. His postoperative course has been largely unremarkable as relates to pain and associated postoperative recovery. He has had some abdominal discomfort, bloating, and diarrhea. His diarrhea has gotten progressively worse since his current admission. He has had a lack of appetite and poor fluid intakes over the past 5 days. He also is complaining of lightheadedness and a headache. He is also asking to be fed as he is hungry.  Review of systems: A 12 point comprehensive review of systems was obtained and is negative unless otherwise stated in the history of present illness.  Patient Active Problem List   Diagnosis Date Noted  . 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 Elevated lactic acid level 07/02/2016  . AKI (acute kidney injury) (HCC) 07/02/2016  . Sepsis (HCC) 07/02/2016  . Tinea versicolor 06/21/2016  . URI (upper respiratory infection) 11/21/2015  . Routine general medical examination at a health care facility 01/22/2015    No current facility-administered medications on file prior to encounter.    Current Outpatient Prescriptions on File Prior to Encounter  Medication Sig Dispense Refill  . ibuprofen (ADVIL,MOTRIN) 600 MG tablet Take 1 tablet (600 mg total) by mouth every 6 (six) hours as needed for moderate pain. 30 tablet 0  . ketoconazole (NIZORAL) 200 MG tablet Take 1 tablet (200 mg total) by mouth daily. 10 tablet 0  . traMADol (ULTRAM) 50 MG tablet Take 1 tablet (50 mg total) by mouth every 8 (eight) hours as needed. 12 tablet 0    Past Medical History:  Diagnosis Date  . Migraine   . Testicular torsion     Past  Surgical History:  Procedure Laterality Date  . ORCHIECTOMY Bilateral 06/24/2016   Procedure: SCROTAL EXPLORATION, RIGHT ORCHIOPEXY, LEFT ORCHIECTOMY;  Surgeon: Marcine Matar, MD;  Location: [facility];  Service: Urology;  Laterality: Bilateral;  . WISDOM TOOTH EXTRACTION      Social History  Substance Use Topics  . Smoking status: Never Smoker  . Smokeless tobacco: Never Used  . Alcohol use No    Family History  Problem Relation Age of Onset  . Hypertension Mother   . Hypertension Paternal Grandmother   . Hypertension Paternal Grandfather     PE: Vitals:   07/02/16 0139 07/02/16 0330 07/02/16 0418 07/02/16 0640  BP: 139/66   (!) 95/50  Pulse: (!) 119   86  Resp: 20   16  Temp: (!) 102.8 F (39.3 C) (!) 101.5 F (38.6 C) (!) 100.5 F (38.1 C) 98.7 F (37.1 C)  TempSrc: Oral Oral Oral Oral  SpO2: 99%   99%  Weight: 67.3 kg (148 lb 6.4 oz)     Height: 5' 9.25" (1.759 m)      Patient appears to be in no acute distress  patient is alert and oriented x3 Atraumatic normocephalic head No cervical or supraclavicular lymphadenopathy appreciated No increased work of breathing, no audible wheezes/rhonchi Regular sinus rhythm/rate Abdomen is soft, Mildly tender, with mild distention, but no CVA or suprapubic tenderness Lower extremities are symmetric without appreciable edema The patient's left hemiscrotum is empty, soft, nontender. His incision in the scrotal raphae is clean/dry/intact without evidence of infection. His right testicle is distended  and palpably normal. Grossly neurologically intact No identifiable skin lesions   Recent Labs  07/01/16 2045 07/02/16 0552  WBC 15.2* 11.8*  HGB 13.4 11.3*  HCT 38.5* 32.8*    Recent Labs  07/01/16 2045 07/02/16 0552  NA 137 137  K 3.4* 3.2*  CL 102 106  CO2 23 25  GLUCOSE 128* 95  BUN 20 15  CREATININE 1.75* 1.22  CALCIUM 9.4 8.4*    Recent Labs  07/02/16 0216  INR 1.49   No results for input(s): LABURIN in  the last 72 hours. Results for orders placed or performed during the hospital encounter of 07/01/16  Rapid strep screen (not at [facility])     Status: None   Collection Time: 07/01/16  8:40 PM  Result Value Ref Range Status   Streptococcus, Group A Screen (Direct) NEGATIVE NEGATIVE Final    Comment: (NOTE) A Rapid Antigen test may result negative if the antigen level in the sample is below the detection level of this test. The FDA has not cleared this test as a stand-alone test therefore the rapid antigen negative result has reflexed to a Group A Strep culture.     Imaging: I have independently reviewed the patient's scrotal ultrasound demonstrating a small fluid collection in the left hemiscrotum consistent with postsurgical change/seroma or hematoma. No evidence of abscess.  Imp: The etiology of the patient's hemodynamic instability is quite puzzling but does not appear to be related to his recent surgery. He may have developed C. difficile colitis or contracted some gastroenteritis from his most recent hospitalization and with associated dehydration presented with hemodynamic instability. With hydration and IV antibiotics his lactate is trending back to normal, his renal function is improving, and his white count is improving. All of this may have just been related to dehydration. Although with fever, idiopathic infectious process may be driving all of this. He is also complaining of headache and lightheadedness. I am not sure if this is related to other symptoms or not.  Recommendations: It might be worth checking a C. difficile on this patient. Otherwise, I would continue hydration, allow the patient to eat, transition him to oral antibiotics, and monitor him clinically. If his headache and neurologic symptoms are not improved over the next 12-24 hours U might consider some imaging of his head.  The patient has scheduled follow-up with Dr. Rodman Pickleall stent. He should keep this follow-up. I'm happy to  assist where and when necessary. Please contact me with further questions/concerns.  Berniece SalinesHERRICK, Ayriel Texidor W

## 2016-07-02 NOTE — H&P (Signed)
History and Physical    Paul StandsWilliam Guadalupe Beasley:[id number] DOB: 1991-10-03 DOA: 07/01/2016  PCP: Myrlene BrokerElizabeth A Crawford, MD Consultants:  Retta Dionesahlstedt - urology Patient coming from: home - lives with parents and 2 sisters  Chief Complaint: fever  HPI: Paul Beasley is a 25 y.o. male with medical history significant of recent (9/8) orchiectomy for testicular torsion presenting with acute onset of fever today.  Mother noticed that he had a fever she couldn't get rid of.  She was concerned that he had an infection from surgery.  When he went to get up, he passed out.  Did not lose consciousness, just fell into the floor. He seemed fine yesterday. Fever started about 1230-1300 today.  Fever up to 103.  Dizzy with trying to walk.  Chills/shaking.  Nausea persistent.   Ate/drank normally yesterday, today with limited PO - small but of ice cream and 2 cups of iced water.  No chest pain.  Last Tuesday (9/5), developed testicular pain.  Diagnosed with epididimitis - given Cipro, Toradol, Tramadol, Ibuprofen.  Sent for US on Friday (9/8) and it showed no infection but torsion.  Had orchiectomy.  No problems with the wound itself at all.    ED Course:  Given Vnc/Zosyn, Tylenol, 2L bolus for hypotension.  Urology consulted and thinks unrelated to surgery.  Review of Systems: As per HPI; otherwise 10 point review of systems reviewed and negative.   Ambulatory Status:  Ambulatory without assistance  Past Medical History:  Diagnosis Date  . Migraine   . Testicular torsion     Past Surgical History:  Procedure Laterality Date  . ORCHIECTOMY Bilateral 06/24/2016   Procedure: SCROTAL EXPLORATION, RIGHT ORCHIOPEXY, LEFT ORCHIECTOMY;  Surgeon: Marcine MatarStephen Dahlstedt, MD;  Location: [facility];  Service: Urology;  Laterality: Bilateral;  . WISDOM TOOTH EXTRACTION      Social History   Social History  . Marital status: Single    Spouse name: N/A  . Number of children: N/A  . Years of education: N/A   Occupational  History  . [employer]    Social History Main Topics  . Smoking status: Never Smoker  . Smokeless tobacco: Never Used  . Alcohol use No  . Drug use: No  . Sexual activity: Not on file   Other Topics Concern  . Not on file   Social History Narrative  . No narrative on file    No Known Allergies  Family History  Problem Relation Age of Onset  . Hypertension Mother   . Hypertension Paternal Grandmother   . Hypertension Paternal Grandfather     Prior to Admission medications   Medication Sig Start Date End Date Taking? Authorizing Provider  ibuprofen (ADVIL,MOTRIN) 600 MG tablet Take 1 tablet (600 mg total) by mouth every 6 (six) hours as needed for moderate pain. 06/21/16  Yes Georgina QuintAleksei V Plotnikov, MD  ketoconazole (NIZORAL) 200 MG tablet Take 1 tablet (200 mg total) by mouth daily. 06/21/16  Yes Georgina QuintAleksei V Plotnikov, MD  traMADol (ULTRAM) 50 MG tablet Take 1 tablet (50 mg total) by mouth every 8 (eight) hours as needed. 06/21/16  Yes Tresa GarterAleksei V Plotnikov, MD    Physical Exam: Vitals:   07/01/16 2330 07/01/16 2344 07/02/16 0030 07/02/16 0031  BP: 117/61 117/61 118/80 118/80  Pulse: 98 92 102 103  Resp: 14 15  17   Temp:      TempSrc:      SpO2: 100% 100% 100% 99%     General:  Appears calm and comfortable and  is NAD Eyes: PERRL, EOMI, normal lids, iris ENT:  grossly normal hearing, lips & tongue, mmm Neck:  no LAD, masses or thyromegaly Cardiovascular: RRR, no m/r/g. No LE edema.  Respiratory:  CTA bilaterally, no w/r/r. Normal respiratory effort. Abdomen:  soft, ntnd, NABS Skin:  no rash or induration seen on limited exam Musculoskeletal:  grossly normal tone BUE/BLE, good ROM, no bony abnormality Psychiatric:  grossly normal mood and affect, speech fluent and appropriate, AOx3 Neurologic:  CN 2-12 grossly intact, moves all extremities in coordinated fashion, sensation intact GU: intact wound that is clean and without erythema along the midline scrotal sac  Labs on  Admission: I have personally reviewed following labs and imaging studies  CBC:  Recent Labs Lab 07/01/16 2045  WBC 15.2*  NEUTROABS 12.5*  HGB 13.4  HCT 38.5*  MCV 85.0  PLT 217   Basic Metabolic Panel:  Recent Labs Lab 07/01/16 2045  NA 137  K 3.4*  CL 102  CO2 23  GLUCOSE 128*  BUN 20  CREATININE 1.75*  CALCIUM 9.4   GFR: Estimated Creatinine Clearance: 61.6 mL/min (by C-G formula based on SCr of 1.75 mg/dL (H)). Liver Function Tests: No results for input(s): AST, ALT, ALKPHOS, BILITOT, PROT, ALBUMIN in the last 168 hours. No results for input(s): LIPASE, AMYLASE in the last 168 hours. No results for input(s): AMMONIA in the last 168 hours. Coagulation Profile: No results for input(s): INR, PROTIME in the last 168 hours. Cardiac Enzymes: No results for input(s): CKTOTAL, CKMB, CKMBINDEX, TROPONINI in the last 168 hours. BNP (last 3 results) No results for input(s): PROBNP in the last 8760 hours. HbA1C: No results for input(s): HGBA1C in the last 72 hours. CBG: No results for input(s): GLUCAP in the last 168 hours. Lipid Profile: No results for input(s): CHOL, HDL, LDLCALC, TRIG, CHOLHDL, LDLDIRECT in the last 72 hours. Thyroid Function Tests: No results for input(s): TSH, T4TOTAL, FREET4, T3FREE, THYROIDAB in the last 72 hours. Anemia Panel: No results for input(s): VITAMINB12, FOLATE, FERRITIN, TIBC, IRON, RETICCTPCT in the last 72 hours. Urine analysis: No results found for: COLORURINE, APPEARANCEUR, LABSPEC, PHURINE, GLUCOSEU, HGBUR, BILIRUBINUR, KETONESUR, PROTEINUR, UROBILINOGEN, NITRITE, LEUKOCYTESUR  Creatinine Clearance: Estimated Creatinine Clearance: 61.6 mL/min (by C-G formula based on SCr of 1.75 mg/dL (H)).  Sepsis Labs: @LABRCNTIP (procalcitonin:4,lacticidven:4) ) Recent Results (from the past 240 hour(s))  Rapid strep screen (not at [facility])     Status: None   Collection Time: 07/01/16  8:40 PM  Result Value Ref Range Status   Streptococcus,  Group A Screen (Direct) NEGATIVE NEGATIVE Final    Comment: (NOTE) A Rapid Antigen test may result negative if the antigen level in the sample is below the detection level of this test. The FDA has not cleared this test as a stand-alone test therefore the rapid antigen negative result has reflexed to a Group A Strep culture.      Radiological Exams on Admission: Dg Chest 2 View  Result Date: 07/01/2016 CLINICAL DATA:  Fever and shortness of breath.  Recent surgery. EXAM: CHEST  2 VIEW COMPARISON:  None. FINDINGS: The cardiomediastinal silhouette is unremarkable. There is no evidence of focal airspace disease, pulmonary edema, suspicious pulmonary nodule/mass, pleural effusion, or pneumothorax. No acute bony abnormalities are identified. IMPRESSION: No active cardiopulmonary disease. Electronically Signed   By: Harmon Pier M.D.   On: 07/01/2016 21:42    EKG: Independently reviewed.  Sinus tachycardia with rate 103; nonspecific ST changes with no evidence of acute ischemia  Assessment/Plan Principal  Problem:   Sepsis (HCC) Active Problems:   Testicular torsion   Elevated lactic acid level   AKI (acute kidney injury) (HCC)   Sepsis -Elevated WBC count, fever, tachycardia with elevated lactate to 2.23 and marked hypotension -Sepsis protocol initiated by ER -Uncertain source - no UA obtained so will order (recent GU surgery), negative CXR, vague symptoms.  Will also test for influenza and HIV. -negative strep testing in ER -Most likely course would seen to be related to recent surgery, but wound appears intact; consider urology consult and possible [place] for abscess if concerns develop -Blood and urine cultures pending -Will place in observation status on Med Surg and continue to monitor -Treat with IV Vanc and Zosyn while awaiting culture results  -Will trend lactate to ensure improvement -Anticipate rapid improvement and so likely okay for discharge in 24-48 hours assuming fever is  resolved  Recent testicular torsion -s/p surgical repair -As above, no current infection apparent -Consider urology consult in AM if new concerns arise  AKI -Presyncope, hypotension likely related to sepsis and prerenal azotemia/volume deficiency -Improved hemodynamics with fluid resuscitation, anticipate rapid improvement with rehydration.  DVT prophylaxis: Early ambulation Code Status:  Full  Family Communication: Parents present throughout evaluation  Disposition Plan:  Home once clinically improved Consults called: Urology (by ER)  Admission status: Observation, med surg    Jonah Blue MD [facility]  If 7PM-7AM, please contact night-coverage [email or web address] Password [email or web address]  07/02/2016, 1:17 AM

## 2016-07-02 NOTE — Progress Notes (Signed)
[facility] PROGRESS NOTE    Progress Note  Paul StandsWilliam Beasley  ZOX:[id number] DOB: 01-09-1991 DOA: 07/01/2016 PCP: Myrlene BrokerElizabeth A Crawford, MD     Brief Narrative:   Paul StandsWilliam Beasley is an 25 y.o. male past medical history for recent orchiectomy on 07/04/2016 that comes in with fever and near syncopal episode.  Assessment/Plan:   Sepsis (HCC) Sepsis physiology is improving with IV vancomycin and Zosyn. It is hard for me to believe that is not related to his recent surgery. He denies all episode of diarrhea at home. We'll get a scrotal ultrasound. Blood cultures are pending, chest x-ray shows no signs of aspiration pneumonia UA shows no leukocytes and negative nitrates. He is defervesced on IV antibiotics. Lactic acidosis resolved with IV fluid hydration and IV antibiotics. Question of his near syncope was likely orthostatic hypotension he relates dizziness upon standing.  AKI (acute kidney injury) (HCC) Likely prerenal resolved with IV fluid hydration.  Hyperkalemia: Replete orally.   DVT prophylaxis: lovenox Family Communication:father Disposition Plan/Barrier to D/C: unable to determine Code Status:     Code Status Orders        Start     Ordered   07/02/16 0138  Full code  Continuous     07/02/16 0137    Code Status History    Date Active Date Inactive Code Status Order ID Comments User Context   This patient has a current code status but no historical code status.        IV Access:    Peripheral IV   Procedures and diagnostic studies:   Dg Chest 2 View  Result Date: 07/01/2016 CLINICAL DATA:  Fever and shortness of breath.  Recent surgery. EXAM: CHEST  2 VIEW COMPARISON:  None. FINDINGS: The cardiomediastinal silhouette is unremarkable. There is no evidence of focal airspace disease, pulmonary edema, suspicious pulmonary nodule/mass, pleural effusion, or pneumothorax. No acute bony abnormalities are identified. IMPRESSION: No active cardiopulmonary  disease. Electronically Signed   By: Harmon PierJeffrey  Hu M.D.   On: 07/01/2016 21:42     Medical Consultants:    None.  Anti-Infectives:   Vancomycin and Zosyn started on 07/01/2016  Subjective:    Paul StandsWilliam Beasley no pain feels better.  Objective:    Vitals:   07/02/16 0139 07/02/16 0330 07/02/16 0418 07/02/16 0640  BP: 139/66   (!) 95/50  Pulse: (!) 119   86  Resp: 20   16  Temp: (!) 102.8 F (39.3 C) (!) 101.5 F (38.6 C) (!) 100.5 F (38.1 C) 98.7 F (37.1 C)  TempSrc: Oral Oral Oral Oral  SpO2: 99%   99%  Weight: 67.3 kg (148 lb 6.4 oz)     Height: 5' 9.25" (1.759 m)      No intake or output data in the 24 hours ending 07/02/16 0748 Filed Weights   07/02/16 0139  Weight: 67.3 kg (148 lb 6.4 oz)    Exam: General exam: In no acute distress. Respiratory system: Good air movement and clear to auscultation. Cardiovascular system: S1 & S2 heard, RRR. No JVD, murmurs, rubs, gallops or clicks.  Gastrointestinal system: Abdomen is nondistended, soft and nontender.  Central nervous system: Alert and oriented. No focal neurological deficits. Extremities: No pedal edema. Skin: No rashes, lesions or ulcers Psychiatry: Judgement and insight appear normal. Mood & affect appropriate.    Data Reviewed:    Labs: Basic Metabolic Panel:  Recent Labs Lab 07/01/16 2045 07/02/16 0552  NA 137 137  K 3.4* 3.2*  CL 102 106  CO2 23 25  GLUCOSE 128* 95  BUN 20 15  CREATININE 1.75* 1.22  CALCIUM 9.4 8.4*   GFR Estimated Creatinine Clearance: 90.4 mL/min (by C-G formula based on SCr of 1.22 mg/dL). Liver Function Tests: No results for input(s): AST, ALT, ALKPHOS, BILITOT, PROT, ALBUMIN in the last 168 hours. No results for input(s): LIPASE, AMYLASE in the last 168 hours. No results for input(s): AMMONIA in the last 168 hours. Coagulation profile  Recent Labs Lab 07/02/16 0216  INR 1.49    CBC:  Recent Labs Lab 07/01/16 2045 07/02/16 0552  WBC 15.2* 11.8*    NEUTROABS 12.5*  --   HGB 13.4 11.3*  HCT 38.5* 32.8*  MCV 85.0 83.2  PLT 217 173   Cardiac Enzymes: No results for input(s): CKTOTAL, CKMB, CKMBINDEX, TROPONINI in the last 168 hours. BNP (last 3 results) No results for input(s): PROBNP in the last 8760 hours. CBG: No results for input(s): GLUCAP in the last 168 hours. D-Dimer: No results for input(s): DDIMER in the last 72 hours. Hgb A1c: No results for input(s): HGBA1C in the last 72 hours. Lipid Profile: No results for input(s): CHOL, HDL, LDLCALC, TRIG, CHOLHDL, LDLDIRECT in the last 72 hours. Thyroid function studies: No results for input(s): TSH, T4TOTAL, T3FREE, THYROIDAB in the last 72 hours.  Invalid input(s): FREET3 Anemia work up: No results for input(s): VITAMINB12, FOLATE, FERRITIN, TIBC, IRON, RETICCTPCT in the last 72 hours. Sepsis Labs:  Recent Labs Lab 07/01/16 2045 07/01/16 2116 07/02/16 0216 07/02/16 0552  PROCALCITON  --   --  1.81  --   WBC 15.2*  --   --  11.8*  LATICACIDVEN  --  2.23* 1.2 0.7   Microbiology Recent Results (from the past 240 hour(s))  Rapid strep screen (not at [facility])     Status: None   Collection Time: 07/01/16  8:40 PM  Result Value Ref Range Status   Streptococcus, Group A Screen (Direct) NEGATIVE NEGATIVE Final    Comment: (NOTE) A Rapid Antigen test may result negative if the antigen level in the sample is below the detection level of this test. The FDA has not cleared this test as a stand-alone test therefore the rapid antigen negative result has reflexed to a Group A Strep culture.      Medications:   . piperacillin-tazobactam (ZOSYN)  IV  3.375 g Intravenous Q8H  . vancomycin  750 mg Intravenous Q12H   Continuous Infusions: . lactated ringers 125 mL/hr at 07/02/16 0203    Time spent: 25 min   LOS: 0 days   Marinda Elk  [facility] Pager [phone]  *Please refer to [email or web address], password [email or web address] to get updated schedule on who will round on  this patient, as hospitalists switch teams weekly. If 7PM-7AM, please contact night-coverage at [email or web address], password [email or web address] for any overnight needs.  07/02/2016, 7:48 AM

## 2016-07-02 NOTE — Progress Notes (Signed)
Pharmacy Antibiotic Note  Paul StandsWilliam Beasley is a 25 y.o. male s/p orchiectomy on 09/18/ 17 now admitted on 07/01/2016 with sepsis.  Pharmacy has been consulted for Vancomycin and Zosyn dosing.  Plan:  Increase vancomycin to 750mg  IV q8h since SCr is improving  Continue Zosyn 3.375gm IV q8h (4hr extended infusions)  Follow up cultures and de-escalate as appropriate  Height: 5' 9.25" (175.9 cm) Weight: 148 lb 6.4 oz (67.3 kg) IBW/kg (Calculated) : 71.28  Temp (24hrs), Avg:100.7 F (38.2 C), Min:98.7 F (37.1 C), Max:102.8 F (39.3 C)   Recent Labs Lab 07/01/16 2045 07/01/16 2116 07/02/16 0216 07/02/16 0552  WBC 15.2*  --   --  11.8*  CREATININE 1.75*  --   --  1.22  LATICACIDVEN  --  2.23* 1.2 0.7    Estimated Creatinine Clearance: 90.4 mL/min (by C-G formula based on SCr of 1.22 mg/dL).    No Known Allergies  Antimicrobials this admission:  9/15 Vanc >> 9/15 Zosyn >>  Dose adjustments this admission:  9/16: increase vanc from 750mg  q12h to q8h for improved renal function  Microbiology results:  9/15 Rapid strep screen: 9/15 Group A strep culture: 9/15 BCx: sent 9/16 UCx: sent 9/16 Influenza: neg 9/16 HIV Ab: IP  Thank you for allowing pharmacy to be a part of this patient's care.  Loralee PacasErin Aashvi Rezabek, PharmD, BCPS Pager: [phone] 07/02/2016 10:46 AM

## 2016-07-02 NOTE — Progress Notes (Signed)
Pharmacy Antibiotic Note  Paul Beasley is a 25 y.o. male admitted on 07/01/2016 with sepsis.  Pharmacy has been consulted for Vancomycin and Zosyn dosing.  Plan: Vancomycin 750mg  IV every 12 hours.  Goal trough 15-20 mcg/mL. Zosyn 3.375g IV q8h (4 hour infusion).  Height: 5' 9.25" (175.9 cm) Weight: 148 lb 6.4 oz (67.3 kg) IBW/kg (Calculated) : 71.28  Temp (24hrs), Avg:101.1 F (38.4 C), Min:100.3 F (37.9 C), Max:102.8 F (39.3 C)   Recent Labs Lab 07/01/16 2045 07/01/16 2116 07/02/16 0216  WBC 15.2*  --   --   CREATININE 1.75*  --   --   LATICACIDVEN  --  2.23* 1.2    Estimated Creatinine Clearance: 63 mL/min (by C-G formula based on SCr of 1.75 mg/dL (H)).    No Known Allergies  Antimicrobials this admission: Vancomycin 07/01/2016 >> Zosyn 07/01/2016 >>   Dose adjustments this admission: -  Microbiology results: pending  Thank you for allowing pharmacy to be a part of this patient's care.  Paul Beasley, Paul Beasley 07/02/2016 5:59 AM

## 2016-07-03 DIAGNOSIS — N179 Acute kidney failure, unspecified: Secondary | ICD-10-CM

## 2016-07-03 DIAGNOSIS — E872 Acidosis: Secondary | ICD-10-CM

## 2016-07-03 DIAGNOSIS — A419 Sepsis, unspecified organism: Principal | ICD-10-CM

## 2016-07-03 LAB — URINE CULTURE: Culture: NO GROWTH

## 2016-07-03 MED ORDER — POTASSIUM CHLORIDE CRYS ER 20 MEQ PO TBCR
40.0000 meq | EXTENDED_RELEASE_TABLET | Freq: Two times a day (BID) | ORAL | Status: DC
  Administered 2016-07-03: 40 meq via ORAL
  Filled 2016-07-03: qty 2

## 2016-07-03 MED ORDER — SACCHAROMYCES BOULARDII 250 MG PO CAPS
250.0000 mg | ORAL_CAPSULE | Freq: Two times a day (BID) | ORAL | Status: DC
  Administered 2016-07-03: 250 mg via ORAL
  Filled 2016-07-03: qty 1

## 2016-07-03 MED ORDER — SACCHAROMYCES BOULARDII 250 MG PO CAPS: 250.0000 mg | ORAL_CAPSULE | Freq: Two times a day (BID) | ORAL | 0 refills | Status: DC

## 2016-07-03 MED ORDER — SULFAMETHOXAZOLE-TRIMETHOPRIM 800-160 MG PO TABS
1.0000 | ORAL_TABLET | Freq: Two times a day (BID) | ORAL | Status: DC
  Administered 2016-07-03: 1 via ORAL
  Filled 2016-07-03 (×2): qty 1

## 2016-07-03 MED ORDER — SULFAMETHOXAZOLE-TRIMETHOPRIM 800-160 MG PO TABS: 1.0000 | ORAL_TABLET | Freq: Two times a day (BID) | ORAL | 0 refills | Status: DC

## 2016-07-03 NOTE — Discharge Summary (Addendum)
Physician Discharge Summary  Paul StandsWilliam Thelen GNF:[id number] DOB: 1991/06/28 DOA: 07/01/2016  PCP: Myrlene BrokerElizabeth A Crawford, MD  Admit date: 07/01/2016 Discharge date: 07/03/2016  Admitted From: home Disposition:  Home  Recommendations for Outpatient Follow-up:  1. Follow up with PCP in 1-2 weeks 2. Please obtain BMP/CBC in one week 3. Follow up with urology in 1 week.   Home Health:no Equipment/Devices:none  Discharge Condition:stable CODE STATUS:full Diet recommendation: Regular  Brief/Interim Summary: Paul StandsWilliam Beasley is an 25 y.o. male past medical history for recent orchiectomy on 07/04/2016 that comes in with fever and near syncopal episode  Discharge Diagnoses:  Sepsis (HCC) of unclear source: Unclear source, he was started on admission on IV vancomycin and Zosyn he would think that this would be related to his recent surgery but is scrotal ultrasound was negative he did not have any erythema or pain in the scrotum. He was not having diarrhea at home, blood cultures are negative till date chest x-ray does not show any signs of aspiration, UA shows no leukocytosis on nitrates. He defervesced in less than 12 hours of IV Vanco and Zosyn, his lactic acidosis resolved with IV fluid hydration. Urology was consulted and could not find a source of his infection. He was changed to Bactrim which she will continue for 2 weeks as an outpatient.  Elevated lactic acid level Resolved with IV fluid hydration and IV antibiotics. Likely due to sepsis.  AKI (acute kidney injury) (HCC) Prerenal in etiology resolved with IV fluid hydration.    Discharge Instructions  Discharge Instructions    Diet - low sodium heart healthy    Complete by:  As directed    Increase activity slowly    Complete by:  As directed        Medication List    TAKE these medications   ibuprofen 600 MG tablet Commonly known as:  ADVIL,MOTRIN Take 1 tablet (600 mg total) by mouth every 6 (six) hours as needed  for moderate pain.   ketoconazole 200 MG tablet Commonly known as:  NIZORAL Take 1 tablet (200 mg total) by mouth daily.   saccharomyces boulardii 250 MG capsule Commonly known as:  FLORASTOR Take 1 capsule (250 mg total) by mouth 2 (two) times daily.   sulfamethoxazole-trimethoprim 800-160 MG tablet Commonly known as:  BACTRIM DS,SEPTRA DS Take 1 tablet by mouth every 12 (twelve) hours.   traMADol 50 MG tablet Commonly known as:  ULTRAM Take 1 tablet (50 mg total) by mouth every 8 (eight) hours as needed.       No Known Allergies  Consultations:  Urology   Procedures/Studies: Dg Chest 2 View  Result Date: 07/01/2016 CLINICAL DATA:  Fever and shortness of breath.  Recent surgery. EXAM: CHEST  2 VIEW COMPARISON:  None. FINDINGS: The cardiomediastinal silhouette is unremarkable. There is no evidence of focal airspace disease, pulmonary edema, suspicious pulmonary nodule/mass, pleural effusion, or pneumothorax. No acute bony abnormalities are identified. IMPRESSION: No active cardiopulmonary disease. Electronically Signed   By: Harmon PierJeffrey  Hu M.D.   On: 07/01/2016 21:42   [place] Scrotum  Result Date: 07/02/2016 CLINICAL DATA:  Recent LEFT orchiectomy for LEFT testicular torsion. Fever EXAM: ULTRASOUND OF SCROTUM TECHNIQUE: Complete ultrasound examination of the testicles, epididymis, and other scrotal structures was performed. COMPARISON:  Ultrasound 06/24/2016 FINDINGS: Right testicle Measurements: Normal in size and homogeneous echotexture measuring 4.8 x 2.7 x 2.7 cm. Normal testicular striations present. Normal color Doppler flow. Left testicle Measurements:  Surgically absent. Right epididymis:  Normal in size and  appearance. Left epididymis:  Surgically absent Hydrocele: Within the LEFT hemiscrotum, there is a the fluid collection with thin internal septations. No significant hypervascularity or thickening. Collection measures 2.4 x 1.6 x 3.3 cm. Varicocele:  None visualized.  IMPRESSION: 1. Septated fluid collection within the LEFT hemiscrotum following orchectomy without evidence of abscess formation. This is favored benign postsurgical finding. 2. Normal RIGHT testicle. Electronically Signed   By: Genevive Bi M.D.   On: 07/02/2016 09:55   US Scrotum  Result Date: 06/24/2016 CLINICAL DATA:  Acute left testicular pain. EXAM: SCROTAL ULTRASOUND DOPPLER ULTRASOUND OF THE TESTICLES TECHNIQUE: Complete ultrasound examination of the testicles, epididymis, and other scrotal structures was performed. Color and spectral Doppler ultrasound were also utilized to evaluate blood flow to the testicles. COMPARISON:  None. FINDINGS: Right testicle Measurements: 4.9 x 2.9 x 2.5 cm. No mass or microlithiasis visualized. Left testicle Measurements: 4.6 x 3.4 x 2.9 cm. The testicle is heterogeneous in appearance. Decreased venous flow is noted on Doppler. No definite arterial flow is noted. Right epididymis:  Normal in size and appearance. Left epididymis: Enlarged with increased flow suggesting epididymitis. Hydrocele:  Minimal left hydrocele is noted. Varicocele:  None visualized. Pulsed Doppler interrogation of both testes demonstrates normal low resistance arterial and venous waveforms of the right testicle. Decreased venous flow is noted in the left, with no definite arterial flow seen in left testicle. Scrotal wall thickening is noted. IMPRESSION: Left testicle appears to be hypoechoic and heterogeneous in appearance, suggesting possible edema, with no definite arterial flow noted on Doppler. This is concerning for testicular torsion. Left epididymis appears to be enlarged also suggesting possible epididymitis. Critical Value/emergent results were called by telephone at the time of interpretation on 06/24/2016 at 3:49 pm to Dr. Jacinta Shoe , who verbally acknowledged these results. Electronically Signed   By: Lupita Raider, M.D.   On: 06/24/2016 15:50   [place] Art/ven Flow Abd Pelv  Doppler  Result Date: 06/24/2016 CLINICAL DATA:  Acute left testicular pain. EXAM: SCROTAL ULTRASOUND DOPPLER ULTRASOUND OF THE TESTICLES TECHNIQUE: Complete ultrasound examination of the testicles, epididymis, and other scrotal structures was performed. Color and spectral Doppler ultrasound were also utilized to evaluate blood flow to the testicles. COMPARISON:  None. FINDINGS: Right testicle Measurements: 4.9 x 2.9 x 2.5 cm. No mass or microlithiasis visualized. Left testicle Measurements: 4.6 x 3.4 x 2.9 cm. The testicle is heterogeneous in appearance. Decreased venous flow is noted on Doppler. No definite arterial flow is noted. Right epididymis:  Normal in size and appearance. Left epididymis: Enlarged with increased flow suggesting epididymitis. Hydrocele:  Minimal left hydrocele is noted. Varicocele:  None visualized. Pulsed Doppler interrogation of both testes demonstrates normal low resistance arterial and venous waveforms of the right testicle. Decreased venous flow is noted in the left, with no definite arterial flow seen in left testicle. Scrotal wall thickening is noted. IMPRESSION: Left testicle appears to be hypoechoic and heterogeneous in appearance, suggesting possible edema, with no definite arterial flow noted on Doppler. This is concerning for testicular torsion. Left epididymis appears to be enlarged also suggesting possible epididymitis. Critical Value/emergent results were called by telephone at the time of interpretation on 06/24/2016 at 3:49 pm to Dr. Jacinta Shoe , who verbally acknowledged these results. Electronically Signed   By: Lupita Raider, M.D.   On: 06/24/2016 15:50    Ultrasound shows no abnormalities.   Subjective:   Discharge Exam: Vitals:   07/02/16 2259 07/03/16 0541  BP: (!) 105/50 122/62  Pulse: 86 83  Resp: 16 16  Temp: 98.8 F (37.1 C) 98.5 F (36.9 C)   Vitals:   07/02/16 0640 07/02/16 1425 07/02/16 2259 07/03/16 0541  BP: (!) 95/50 112/62 (!)  105/50 122/62  Pulse: 86 88 86 83  Resp: 16 15 16 16   Temp: 98.7 F (37.1 C) 98.3 F (36.8 C) 98.8 F (37.1 C) 98.5 F (36.9 C)  TempSrc: Oral Oral Oral Oral  SpO2: 99% 100% 100% 99%  Weight:      Height:        General: Pt is alert, awake, not in acute distress Cardiovascular: RRR, S1/S2 +, no rubs, no gallops Respiratory: CTA bilaterally, no wheezing, no rhonchi Abdominal: Soft, NT, ND, bowel sounds + Extremities: no edema, no cyanosis    The results of significant diagnostics from this hospitalization (including imaging, microbiology, ancillary and laboratory) are listed below for reference.     Microbiology: Recent Results (from the past 240 hour(s))  Rapid strep screen (not at [facility])     Status: None   Collection Time: 07/01/16  8:40 PM  Result Value Ref Range Status   Streptococcus, Group A Screen (Direct) NEGATIVE NEGATIVE Final    Comment: (NOTE) A Rapid Antigen test may result negative if the antigen level in the sample is below the detection level of this test. The FDA has not cleared this test as a stand-alone test therefore the rapid antigen negative result has reflexed to a Group A Strep culture.      Labs: BNP (last 3 results) No results for input(s): BNP in the last 8760 hours. Basic Metabolic Panel:  Recent Labs Lab 07/01/16 2045 07/02/16 0552  NA 137 137  K 3.4* 3.2*  CL 102 106  CO2 23 25  GLUCOSE 128* 95  BUN 20 15  CREATININE 1.75* 1.22  CALCIUM 9.4 8.4*   Liver Function Tests: No results for input(s): AST, ALT, ALKPHOS, BILITOT, PROT, ALBUMIN in the last 168 hours. No results for input(s): LIPASE, AMYLASE in the last 168 hours. No results for input(s): AMMONIA in the last 168 hours. CBC:  Recent Labs Lab 07/01/16 2045 07/02/16 0552  WBC 15.2* 11.8*  NEUTROABS 12.5*  --   HGB 13.4 11.3*  HCT 38.5* 32.8*  MCV 85.0 83.2  PLT 217 173   Cardiac Enzymes: No results for input(s): CKTOTAL, CKMB, CKMBINDEX, TROPONINI in the last  168 hours. BNP: Invalid input(s): POCBNP CBG: No results for input(s): GLUCAP in the last 168 hours. D-Dimer No results for input(s): DDIMER in the last 72 hours. Hgb A1c No results for input(s): HGBA1C in the last 72 hours. Lipid Profile No results for input(s): CHOL, HDL, LDLCALC, TRIG, CHOLHDL, LDLDIRECT in the last 72 hours. Thyroid function studies No results for input(s): TSH, T4TOTAL, T3FREE, THYROIDAB in the last 72 hours.  Invalid input(s): FREET3 Anemia work up No results for input(s): VITAMINB12, FOLATE, FERRITIN, TIBC, IRON, RETICCTPCT in the last 72 hours. Urinalysis    Component Value Date/Time   COLORURINE YELLOW 07/02/2016 0352   APPEARANCEUR CLEAR 07/02/2016 0352   LABSPEC 1.021 07/02/2016 0352   PHURINE 5.0 07/02/2016 0352   GLUCOSEU NEGATIVE 07/02/2016 0352   HGBUR NEGATIVE 07/02/2016 0352   BILIRUBINUR NEGATIVE 07/02/2016 0352   KETONESUR NEGATIVE 07/02/2016 0352   PROTEINUR NEGATIVE 07/02/2016 0352   NITRITE NEGATIVE 07/02/2016 0352   LEUKOCYTESUR NEGATIVE 07/02/2016 0352   Sepsis Labs Invalid input(s): PROCALCITONIN,  WBC,  LACTICIDVEN Microbiology Recent Results (from the past 240 hour(s))  Rapid strep  screen (not at [facility])     Status: None   Collection Time: 07/01/16  8:40 PM  Result Value Ref Range Status   Streptococcus, Group A Screen (Direct) NEGATIVE NEGATIVE Final    Comment: (NOTE) A Rapid Antigen test may result negative if the antigen level in the sample is below the detection level of this test. The FDA has not cleared this test as a stand-alone test therefore the rapid antigen negative result has reflexed to a Group A Strep culture.      Time coordinating discharge: Over 30 minutes  SIGNED:   Marinda Elk, MD  [facility] 07/03/2016, 8:56 AM Pager   If 7PM-7AM, please contact night-coverage [email or web address] Password [email or web address]

## 2016-07-03 NOTE — Progress Notes (Signed)
Pt's vitals are wnl and tolerating diet. Discussed discharge instructions with both patient and mother. Discharged to home.

## 2016-07-04 LAB — CULTURE, GROUP A STREP (THRC)

## 2016-07-05 ENCOUNTER — Encounter: Payer: Self-pay | Admitting: Internal Medicine

## 2016-07-05 ENCOUNTER — Ambulatory Visit (INDEPENDENT_AMBULATORY_CARE_PROVIDER_SITE_OTHER): Payer: BLUE CROSS/BLUE SHIELD | Admitting: Internal Medicine

## 2016-07-05 DIAGNOSIS — N4400 Torsion of testis, unspecified: Secondary | ICD-10-CM

## 2016-07-05 DIAGNOSIS — N179 Acute kidney failure, unspecified: Secondary | ICD-10-CM

## 2016-07-05 NOTE — Progress Notes (Signed)
   Subjective:    Patient ID: Paul Beasley, male    DOB: February 05, 1991, 25 y.o.   MRN: [id number]  HPI The patient is a 25 YO man coming in for hospital follow up (in with testicular torsion and then sepsis/fever, had surgery to remove gangrenous tissue with testicle and epididymus removed after US with no blood flow). He is still taking bactrim for the possible infection from this weekend. He is having some mild bloating in his stomach and mild loose stools. Denies frank diarrhea or constipation. No to minimal pain left and is not taking anything for pain anymore. Denies fevers or chills. He is taking a probiotic.   [facility], social history reviewed and updated.   Review of Systems  Constitutional: Negative for activity change, appetite change, fatigue, fever and unexpected weight change.  HENT: Negative.   Eyes: Negative.   Respiratory: Negative for cough, chest tightness, shortness of breath and wheezing.   Cardiovascular: Negative for chest pain, palpitations and leg swelling.  Gastrointestinal: Positive for abdominal distention. Negative for abdominal pain, constipation, diarrhea, nausea and vomiting.  Genitourinary: Negative.   Musculoskeletal: Negative.   Neurological: Negative.       Objective:   Physical Exam  Constitutional: He is oriented to person, place, and time. He appears well-developed and well-nourished.  HENT:  Head: Normocephalic and atraumatic.  Eyes: EOM are normal.  Neck: Normal range of motion.  Cardiovascular: Normal rate and regular rhythm.   Pulmonary/Chest: Effort normal and breath sounds normal. No respiratory distress. He has no wheezes. He has no rales.  Abdominal: Soft. Bowel sounds are normal. He exhibits no distension. There is no tenderness. There is no rebound.  Neurological: He is alert and oriented to person, place, and time. Coordination normal.  Skin: Skin is warm and dry.   Vitals:   07/05/16 1056  BP: 122/70  Pulse: 71  Resp: 12  Temp:  98.9 F (37.2 C)  TempSrc: Oral  SpO2: 98%  Weight: 144 lb 12.8 oz (65.7 kg)  Height: 5\' 9"  (1.753 m)      Assessment & Plan:

## 2016-07-05 NOTE — Assessment & Plan Note (Signed)
Resolved and likely due to surgery and pre-renal. Taking PO well and no symptoms now.

## 2016-07-05 NOTE — Assessment & Plan Note (Signed)
Doing well s/p 1 testicle remove and 1 fixed to the scrotum to prevent torsion in the future. He is still taking bactrim and has follow up with urology. Does not need labs today.

## 2016-07-05 NOTE — Patient Instructions (Signed)
Keep taking the antibiotics until they are gone.   We have included some stretching exercises for the back and if they do not help call the office back and you can schedule with Dr. Terrilee FilesZach Beasley for the back.    Back Exercises The following exercises strengthen the muscles that help to support the back. They also help to keep the lower back flexible. Doing these exercises can help to prevent back pain or lessen existing pain. If you have back pain or discomfort, try doing these exercises 2-3 times each day or as told by your health care provider. When the pain goes away, do them once each day, but increase the number of times that you repeat the steps for each exercise (do more repetitions). If you do not have back pain or discomfort, do these exercises once each day or as told by your health care provider. EXERCISES Single Knee to Chest Repeat these steps 3-5 times for each leg: 1. Lie on your back on a firm bed or the floor with your legs extended. 2. Bring one knee to your chest. Your other leg should stay extended and in contact with the floor. 3. Hold your knee in place by grabbing your knee or thigh. 4. Pull on your knee until you feel a gentle stretch in your lower back. 5. Hold the stretch for 10-30 seconds. 6. Slowly release and straighten your leg. Pelvic Tilt Repeat these steps 5-10 times: 1. Lie on your back on a firm bed or the floor with your legs extended. 2. Bend your knees so they are pointing toward the ceiling and your feet are flat on the floor. 3. Tighten your lower abdominal muscles to press your lower back against the floor. This motion will tilt your pelvis so your tailbone points up toward the ceiling instead of pointing to your feet or the floor. 4. With gentle tension and even breathing, hold this position for 5-10 seconds. Cat-Cow Repeat these steps until your lower back becomes more flexible: 1. Get into a hands-and-knees position on a firm surface. Keep your hands  under your shoulders, and keep your knees under your hips. You may place padding under your knees for comfort. 2. Let your head hang down, and point your tailbone toward the floor so your lower back becomes rounded like the back of a cat. 3. Hold this position for 5 seconds. 4. Slowly lift your head and point your tailbone up toward the ceiling so your back forms a sagging arch like the back of a cow. 5. Hold this position for 5 seconds. Press-Ups Repeat these steps 5-10 times: 1. Lie on your abdomen (face-down) on the floor. 2. Place your palms near your head, about shoulder-width apart. 3. While you keep your back as relaxed as possible and keep your hips on the floor, slowly straighten your arms to raise the top half of your body and lift your shoulders. Do not use your back muscles to raise your upper torso. You may adjust the placement of your hands to make yourself more comfortable. 4. Hold this position for 5 seconds while you keep your back relaxed. 5. Slowly return to lying flat on the floor. Bridges Repeat these steps 10 times: 1. Lie on your back on a firm surface. 2. Bend your knees so they are pointing toward the ceiling and your feet are flat on the floor. 3. Tighten your buttocks muscles and lift your buttocks off of the floor until your waist is at almost the  same height as your knees. You should feel the muscles working in your buttocks and the back of your thighs. If you do not feel these muscles, slide your feet 1-2 inches farther away from your buttocks. 4. Hold this position for 3-5 seconds. 5. Slowly lower your hips to the starting position, and allow your buttocks muscles to relax completely. If this exercise is too easy, try doing it with your arms crossed over your chest. Abdominal Crunches Repeat these steps 5-10 times: 1. Lie on your back on a firm bed or the floor with your legs extended. 2. Bend your knees so they are pointing toward the ceiling and your feet are  flat on the floor. 3. Cross your arms over your chest. 4. Tip your chin slightly toward your chest without bending your neck. 5. Tighten your abdominal muscles and slowly raise your trunk (torso) high enough to lift your shoulder blades a tiny bit off of the floor. Avoid raising your torso higher than that, because it can put too much stress on your low back and it does not help to strengthen your abdominal muscles. 6. Slowly return to your starting position. Back Lifts Repeat these steps 5-10 times: 1. Lie on your abdomen (face-down) with your arms at your sides, and rest your forehead on the floor. 2. Tighten the muscles in your legs and your buttocks. 3. Slowly lift your chest off of the floor while you keep your hips pressed to the floor. Keep the back of your head in line with the curve in your back. Your eyes should be looking at the floor. 4. Hold this position for 3-5 seconds. 5. Slowly return to your starting position. SEEK MEDICAL CARE IF:  Your back pain or discomfort gets much worse when you do an exercise.  Your back pain or discomfort does not lessen within 2 hours after you exercise. If you have any of these problems, stop doing these exercises right away. Do not do them again unless your health care provider says that you can. SEEK IMMEDIATE MEDICAL CARE IF:  You develop sudden, severe back pain. If this happens, stop doing the exercises right away. Do not do them again unless your health care provider says that you can.   This information is not intended to replace advice given to you by your health care provider. Make sure you discuss any questions you have with your health care provider.   Document Released: 11/10/2004 Document Revised: 06/24/2015 Document Reviewed: 11/27/2014 Elsevier Interactive Patient Education [employer]

## 2016-07-05 NOTE — Progress Notes (Signed)
Pre visit review using our clinic review tool, if applicable. No additional management support is needed unless otherwise documented below in the visit note. 

## 2016-07-07 LAB — CULTURE, BLOOD (ROUTINE X 2)
Culture: NO GROWTH
Culture: NO GROWTH

## 2017-12-12 DIAGNOSIS — H40013 Open angle with borderline findings, low risk, bilateral: Secondary | ICD-10-CM | POA: Diagnosis not present

## 2017-12-29 ENCOUNTER — Ambulatory Visit: Payer: BLUE CROSS/BLUE SHIELD | Admitting: Emergency Medicine

## 2017-12-29 ENCOUNTER — Encounter: Payer: Self-pay | Admitting: Emergency Medicine

## 2017-12-29 ENCOUNTER — Other Ambulatory Visit: Payer: Self-pay

## 2017-12-29 VITALS — BP 130/60 | HR 78 | Temp 97.9°F | Resp 16 | Ht 69.0 in | Wt 150.0 lb

## 2017-12-29 DIAGNOSIS — N342 Other urethritis: Secondary | ICD-10-CM | POA: Insufficient documentation

## 2017-12-29 DIAGNOSIS — R350 Frequency of micturition: Secondary | ICD-10-CM

## 2017-12-29 LAB — POCT URINALYSIS DIP (MANUAL ENTRY)
Bilirubin, UA: NEGATIVE
Blood, UA: NEGATIVE
Glucose, UA: NEGATIVE mg/dL
Nitrite, UA: NEGATIVE
Protein Ur, POC: NEGATIVE mg/dL
Spec Grav, UA: 1.025 (ref 1.010–1.025)
Urobilinogen, UA: 2 E.U./dL — AB
pH, UA: 7 (ref 5.0–8.0)

## 2017-12-29 MED ORDER — AZITHROMYCIN 500 MG PO TABS: 1000.0000 mg | ORAL_TABLET | Freq: Once | ORAL | 0 refills | Status: AC

## 2017-12-29 MED ORDER — CEFTRIAXONE SODIUM 250 MG IJ SOLR
250.0000 mg | Freq: Once | INTRAMUSCULAR | Status: AC
  Administered 2017-12-29: 250 mg via INTRAMUSCULAR

## 2017-12-29 NOTE — Patient Instructions (Addendum)
     IF you received an x-ray today, you will receive an invoice from [facility]. Please contact [facility] at [phone] with questions or concerns regarding your invoice.   IF you received labwork today, you will receive an invoice from [facility]. Please contact [facility] at [phone] with questions or concerns regarding your invoice.   Our billing staff will not be able to assist you with questions regarding bills from these companies.  You will be contacted with the lab results as soon as they are available. The fastest way to get your results is to activate your My Chart account. Instructions are located on the last page of this paperwork. If you have not heard from us regarding the results in 2 weeks, please contact this office.     Urethritis, Adult Urethritis is an inflammation of the tube through which urine exits your bladder (urethra). What are the causes? Urethritis is often caused by an infection in your urethra. The infection can be viral, like herpes. The infection can also be bacterial, like gonorrhea. What increases the risk? Risk factors of urethritis include:  Having sex without using a condom.  Having multiple sexual partners.  Having poor hygiene.  What are the signs or symptoms? Symptoms of urethritis are less noticeable in women than in men. These symptoms include:  Burning feeling when you urinate (dysuria).  Discharge from your urethra.  Blood in your urine (hematuria).  Urinating more than usual.  How is this diagnosed? To confirm a diagnosis of urethritis, your health care provider will do the following:  Ask about your sexual history.  Perform a physical exam.  Have you provide a sample of your urine for lab testing.  Use a cotton swab to gently collect a sample from your urethra for lab testing.  How is this treated? It is important to treat urethritis. Depending on the cause, untreated urethritis may lead to serious  genital infections and possibly infertility. Urethritis caused by a bacterial infection is treated with antibiotic medicine. All sexual partners must be treated. Follow these instructions at home:  Do not have sex until the test results are known and treatment is completed, even if your symptoms go away before you finish treatment.  If you were prescribed an antibiotic, finish it all even if you start to feel better. Contact a health care provider if:  Your symptoms are not improved in 3 days.  Your symptoms are getting worse.  You develop abdominal pain or pelvic pain (in women).  You develop joint pain.  You have a fever. Get help right away if:  You have severe pain in the belly, back, or side.  You have repeated vomiting. This information is not intended to replace advice given to you by your health care provider. Make sure you discuss any questions you have with your health care provider. Document Released: 03/29/2001 Document Revised: 03/10/2016 Document Reviewed: 06/03/2013 Elsevier Interactive Patient Education  2017 [employer] Inc[employer]  

## 2017-12-29 NOTE — Progress Notes (Signed)
Paul Beasley 27 y.o.   Chief Complaint  Patient presents with  . Penile Discharge    since Wednesday , bloodwork for STD   . Urinary Tract Infection    frequent urination     HISTORY OF PRESENT ILLNESS: This is a 27 y.o. male complaining of burning on urination as well as frequency for 2-3 days.  Noticed some yellow staining on his underwear.  Sexually active.  Denies abdominal pain.  Denies pelvic pain.  Denies hematuria.  Denies hematospermia.  No other significant symptoms.  HPI   Prior to Admission medications   Medication Sig Start Date End Date Taking? Authorizing Provider  saccharomyces boulardii (FLORASTOR) 250 MG capsule Take 1 capsule (250 mg total) by mouth 2 (two) times daily. Patient not taking: Reported on 12/29/2017 07/03/16   Marinda ElkFeliz Ortiz, Abraham, MD  sulfamethoxazole-trimethoprim (BACTRIM DS,SEPTRA DS) 800-160 MG tablet Take 1 tablet by mouth every 12 (twelve) hours. Patient not taking: Reported on 12/29/2017 07/03/16   Marinda ElkFeliz Ortiz, Abraham, MD    No Known Allergies  Patient Active Problem List   Diagnosis Date Noted  . 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 Elevated lactic acid level 07/02/2016  . AKI (acute kidney injury) (HCC) 07/02/2016  . Sepsis (HCC) 07/02/2016  . Tinea versicolor 06/21/2016  . URI (upper respiratory infection) 11/21/2015  . Routine general medical examination at a health care facility 01/22/2015    Past Medical History:  Diagnosis Date  . Migraine   . Testicular torsion     Past Surgical History:  Procedure Laterality Date  . ORCHIECTOMY Bilateral 06/24/2016   Procedure: SCROTAL EXPLORATION, RIGHT ORCHIOPEXY, LEFT ORCHIECTOMY;  Surgeon: Marcine MatarStephen Dahlstedt, MD;  Location: [facility];  Service: Urology;  Laterality: Bilateral;  . WISDOM TOOTH EXTRACTION      Social History   Socioeconomic History  . Marital status: Single    Spouse name: Not on file  . Number of children: Not on file  . Years of education: Not on file  . Highest  education level: Not on file  Social Needs  . Financial resource strain: Not on file  . Food insecurity - worry: Not on file  . Food insecurity - inability: Not on file  . Transportation needs - medical: Not on file  . Transportation needs - non-medical: Not on file  Occupational History  . Occupation: [employer]  Tobacco Use  . Smoking status: Never Smoker  . Smokeless tobacco: Never Used  Substance and Sexual Activity  . Alcohol use: No    Alcohol/week: 0.0 oz  . Drug use: No  . Sexual activity: Not on file  Other Topics Concern  . Not on file  Social History Narrative  . Not on file    Family History  Problem Relation Age of Onset  . Hypertension Mother   . Hypertension Paternal Grandmother   . Hypertension Paternal Grandfather      Review of Systems  Constitutional: Negative.  Negative for chills and fever.  HENT: Negative.  Negative for sore throat.   Eyes: Negative.  Negative for discharge and redness.  Respiratory: Negative.  Negative for cough and shortness of breath.   Cardiovascular: Negative.  Negative for chest pain and palpitations.  Gastrointestinal: Negative.  Negative for abdominal pain, blood in stool, diarrhea, nausea and vomiting.  Genitourinary: Positive for dysuria and frequency. Negative for flank pain and hematuria.  Musculoskeletal: Negative for back pain, myalgias and neck pain.  Skin: Negative.  Negative for rash.  Neurological: Negative.  Negative for  dizziness and headaches.  Endo/Heme/Allergies: Negative.   All other systems reviewed and are negative.  Vitals:   12/29/17 1601  BP: 130/60  Pulse: 78  Resp: 16  Temp: 97.9 F (36.6 C)  SpO2: 99%     Physical Exam  Constitutional: He is oriented to person, place, and time. He appears well-developed and well-nourished.  HENT:  Head: Normocephalic and atraumatic.  Right Ear: External ear normal.  Left Ear: External ear normal.  Nose: Nose normal.  Mouth/Throat: Oropharynx is clear  and moist.  Eyes: Conjunctivae and EOM are normal. Pupils are equal, round, and reactive to light.  Neck: Normal range of motion. Neck supple.  Cardiovascular: Normal rate and regular rhythm.  Pulmonary/Chest: Effort normal and breath sounds normal.  Abdominal: Soft. Bowel sounds are normal. He exhibits no distension. There is no tenderness. Hernia confirmed negative in the right inguinal area and confirmed negative in the left inguinal area.  Genitourinary: Testes normal and penis normal. Right testis shows no mass and no tenderness. Uncircumcised. No penile erythema or penile tenderness. No discharge found.  Genitourinary Comments: Only one testicle detected.  The other testicle removed due to torsion.  Musculoskeletal: Normal range of motion.  Lymphadenopathy: No inguinal adenopathy noted on the right or left side.  Neurological: He is alert and oriented to person, place, and time. No sensory deficit. He exhibits normal muscle tone.  Skin: Skin is warm and dry. Capillary refill takes less than 2 seconds. No rash noted.  Psychiatric: He has a normal mood and affect. His behavior is normal.  Vitals reviewed.   Results for orders placed or performed in visit on 12/29/17 (from the past 24 hour(s))  POCT urinalysis dipstick     Status: Abnormal   Collection Time: 12/29/17  4:09 PM  Result Value Ref Range   Color, UA yellow yellow   Clarity, UA clear clear   Glucose, UA negative negative mg/dL   Bilirubin, UA negative negative   Ketones, POC UA small (15) (A) negative mg/dL   Spec Grav, UA 0.981 1.914 - 1.025   Blood, UA negative negative   pH, UA 7.0 5.0 - 8.0   Protein Ur, POC negative negative mg/dL   Urobilinogen, UA 2.0 (A) 0.2 or 1.0 E.U./dL   Nitrite, UA Negative Negative   Leukocytes, UA Large (3+) (A) Negative   ASSESSMENT & PLAN: Paul Beasley was seen today for penile discharge and urinary tract infection.  Diagnoses and all orders for this visit:  Urethritis -      cefTRIAXone (ROCEPHIN) injection 250 mg -     azithromycin (ZITHROMAX) 500 MG tablet; Take 2 tablets (1,000 mg total) by mouth once for 1 dose.  Frequent urination -     POCT urinalysis dipstick -     Urine Culture -     Chlamydia trachomatis, DNA, amp probe -     Gonococcus DNA, PCR -     cefTRIAXone (ROCEPHIN) injection 250 mg -     azithromycin (ZITHROMAX) 500 MG tablet; Take 2 tablets (1,000 mg total) by mouth once for 1 dose.  A total of 30 minutes was spent in the room with the patient, greater than 50% of which was in counseling/coordination of care.   Patient Instructions       IF you received an x-ray today, you will receive an invoice from [facility]. Please contact [facility] at [phone] with questions or concerns regarding your invoice.   IF you received labwork today, you will receive an  invoice from [place]. Please contact [facility] at [phone] with questions or concerns regarding your invoice.   Our billing staff will not be able to assist you with questions regarding bills from these companies.  You will be contacted with the lab results as soon as they are available. The fastest way to get your results is to activate your My Chart account. Instructions are located on the last page of this paperwork. If you have not heard from [place] regarding the results in 2 weeks, please contact this office.      Urethritis, Adult Urethritis is an inflammation of the tube through which urine exits your bladder (urethra). What are the causes? Urethritis is often caused by an infection in your urethra. The infection can be viral, like herpes. The infection can also be bacterial, like gonorrhea. What increases the risk? Risk factors of urethritis include:  Having sex without using a condom.  Having multiple sexual partners.  Having poor hygiene.  What are the signs or symptoms? Symptoms of urethritis are less noticeable in women than in men. These  symptoms include:  Burning feeling when you urinate (dysuria).  Discharge from your urethra.  Blood in your urine (hematuria).  Urinating more than usual.  How is this diagnosed? To confirm a diagnosis of urethritis, your health care provider will do the following:  Ask about your sexual history.  Perform a physical exam.  Have you provide a sample of your urine for lab testing.  Use a cotton swab to gently collect a sample from your urethra for lab testing.  How is this treated? It is important to treat urethritis. Depending on the cause, untreated urethritis may lead to serious genital infections and possibly infertility. Urethritis caused by a bacterial infection is treated with antibiotic medicine. All sexual partners must be treated. Follow these instructions at home:  Do not have sex until the test results are known and treatment is completed, even if your symptoms go away before you finish treatment.  If you were prescribed an antibiotic, finish it all even if you start to feel better. Contact a health care provider if:  Your symptoms are not improved in 3 days.  Your symptoms are getting worse.  You develop abdominal pain or pelvic pain (in women).  You develop joint pain.  You have a fever. Get help right away if:  You have severe pain in the belly, back, or side.  You have repeated vomiting. This information is not intended to replace advice given to you by your health care provider. Make sure you discuss any questions you have with your health care provider. Document Released: 03/29/2001 Document Revised: 03/10/2016 Document Reviewed: 06/03/2013 Elsevier Interactive Patient Education  2017 Elsevier Inc.      Edwina Barth, MD [facility]

## 2017-12-30 LAB — URINE CULTURE: Organism ID, Bacteria: NO GROWTH

## 2017-12-31 LAB — CHLAMYDIA TRACHOMATIS, DNA, AMP PROBE: Chlamydia trachomatis, NAA: NEGATIVE

## 2017-12-31 LAB — GONOCOCCUS DNA, PCR: Neisseria gonorrhoeae by PCR: NEGATIVE

## 2018-01-01 ENCOUNTER — Encounter: Payer: Self-pay | Admitting: Emergency Medicine

## 2018-01-16 ENCOUNTER — Other Ambulatory Visit: Payer: Self-pay

## 2018-01-16 ENCOUNTER — Encounter: Payer: Self-pay | Admitting: Family Medicine

## 2018-01-16 ENCOUNTER — Ambulatory Visit: Payer: BLUE CROSS/BLUE SHIELD | Admitting: Family Medicine

## 2018-01-16 VITALS — BP 140/90 | HR 85 | Temp 98.5°F | Ht 69.5 in | Wt 147.8 lb

## 2018-01-16 DIAGNOSIS — Z202 Contact with and (suspected) exposure to infections with a predominantly sexual mode of transmission: Secondary | ICD-10-CM | POA: Diagnosis not present

## 2018-01-16 NOTE — Progress Notes (Signed)
   4/2/20194:07 PM  [name] 19-Jan-1991, 27 y.o. male [id number]  Chief Complaint  Patient presents with  . Exposure to STD  . Anxiety    HPI:   Patient is a 27 y.o. male who presents today for concerns of possible exposure to HIV  Reports last sexual encounter a week ago, male, anal sex. Was told yesterday that she has "undetected HIV" and is now taking medications. He denies any other sexual partners.  He denies any fever, chills, malaise, night sweats, cold symptoms, penile discharge, penile lesions, swollen glands.  He is anxious about this news.     Depression screen PHQ 2/9 01/16/2018  Decreased Interest 0  Down, Depressed, Hopeless 0  PHQ - 2 Score 0    No Known Allergies  Prior to Admission medications   Not on File    Past Medical History:  Diagnosis Date  . Migraine   . Testicular torsion     Past Surgical History:  Procedure Laterality Date  . ORCHIECTOMY Bilateral 06/24/2016   Procedure: SCROTAL EXPLORATION, RIGHT ORCHIOPEXY, LEFT ORCHIECTOMY;  Surgeon: Marcine MatarStephen Dahlstedt, MD;  Location: [facility];  Service: Urology;  Laterality: Bilateral;  . WISDOM TOOTH EXTRACTION      Social History   Tobacco Use  . Smoking status: Never Smoker  . Smokeless tobacco: Never Used  Substance Use Topics  . Alcohol use: No    Alcohol/week: 0.0 oz    Family History  Problem Relation Age of Onset  . Hypertension Mother   . Hypertension Paternal Grandmother   . Hypertension Paternal Grandfather   . Healthy Sister     ROS Per hpi  OBJECTIVE:  Blood pressure 140/90, pulse 85, temperature 98.5 F (36.9 C), temperature source Oral, height 5' 9.5" (1.765 m), weight 147 lb 12.8 oz (67 kg), SpO2 98 %.  Physical Exam  Constitutional: He is oriented to person, place, and time and well-developed, well-nourished, and in no distress.  HENT:  Head: Normocephalic and atraumatic.  Mouth/Throat: Oropharynx is clear and moist.  Eyes: Pupils are equal, round, and  reactive to light. EOM are normal.  Neck: Neck supple.  Cardiovascular: Normal rate and regular rhythm. Exam reveals no gallop and no friction rub.  No murmur heard. Pulmonary/Chest: Effort normal and breath sounds normal. He has no wheezes. He has no rales.  Musculoskeletal: He exhibits no edema.  Lymphadenopathy:    He has no cervical adenopathy.  Neurological: He is alert and oriented to person, place, and time. Gait normal.  Skin: Skin is warm and dry.     ASSESSMENT and PLAN  1. Exposure to STD - RPR - HIV antibody - HCV Ab w Reflex to Quant PCR - Trichomonas vaginalis, RNA - GC/Chlamydia Probe Amp([facility]) - Ambulatory referral to Infectious Disease  No follow-ups on file.    Myles LippsIrma M Santiago, MD [facility] at [facility] [address] [place], [place] [address] Ph.  [phone] Fax [phone]

## 2018-01-16 NOTE — Patient Instructions (Addendum)
[facility] [address], [place], [place] [address] [phone]    IF you received an x-ray today, you will receive an invoice from [facility]. Please contact [facility] at [phone] with questions or concerns regarding your invoice.   IF you received labwork today, you will receive an invoice from [facility]. Please contact [facility] at [phone] with questions or concerns regarding your invoice.   Our billing staff will not be able to assist you with questions regarding bills from these companies.  You will be contacted with the lab results as soon as they are available. The fastest way to get your results is to activate your My Chart account. Instructions are located on the last page of this paperwork. If you have not heard from us regarding the results in 2 weeks, please contact this office.

## 2018-01-17 LAB — HCV AB W REFLEX TO QUANT PCR: HCV Ab: <0.1 s/co ratio (ref 0.0–0.9)

## 2018-01-17 LAB — HCV INTERPRETATION

## 2018-01-17 LAB — HIV ANTIBODY (ROUTINE TESTING W REFLEX): HIV Screen 4th Generation wRfx: NONREACTIVE

## 2018-01-17 LAB — RPR: RPR Ser Ql: NONREACTIVE

## 2018-01-18 LAB — GC/CHLAMYDIA PROBE AMP
Chlamydia trachomatis, NAA: NEGATIVE
Neisseria gonorrhoeae by PCR: NEGATIVE

## 2018-01-18 LAB — TRICHOMONAS VAGINALIS, PROBE AMP: Trich vag by NAA: NEGATIVE

## 2018-01-19 ENCOUNTER — Encounter: Payer: Self-pay | Admitting: Family Medicine

## 2018-01-21 ENCOUNTER — Encounter: Payer: Self-pay | Admitting: Family Medicine

## 2018-01-23 ENCOUNTER — Encounter (HOSPITAL_COMMUNITY): Payer: Self-pay | Admitting: *Deleted

## 2018-01-23 ENCOUNTER — Emergency Department (HOSPITAL_COMMUNITY)
Admission: EM | Admit: 2018-01-23 | Discharge: 2018-01-23 | Discharge status: Against Medical Advice | Payer: Self-pay | Attending: Emergency Medicine | Admitting: Emergency Medicine

## 2018-01-23 ENCOUNTER — Encounter (HOSPITAL_COMMUNITY): Payer: Self-pay

## 2018-01-23 DIAGNOSIS — F430 Acute stress reaction: Secondary | ICD-10-CM | POA: Insufficient documentation

## 2018-01-23 DIAGNOSIS — R42 Dizziness and giddiness: Secondary | ICD-10-CM | POA: Insufficient documentation

## 2018-01-23 DIAGNOSIS — F411 Generalized anxiety disorder: Secondary | ICD-10-CM | POA: Insufficient documentation

## 2018-01-23 DIAGNOSIS — Z5321 Procedure and treatment not carried out due to patient leaving prior to being seen by health care provider: Secondary | ICD-10-CM | POA: Insufficient documentation

## 2018-01-23 NOTE — ED Triage Notes (Signed)
Pt complains of numbness in his face, mouth and hands since this am

## 2018-01-23 NOTE — ED Notes (Signed)
Bed: WTR5 Expected date:  Expected time:  Means of arrival:  Comments: 

## 2018-01-23 NOTE — ED Triage Notes (Signed)
Pt complains lightheadedness, coldness in hands, anxiety for the past couple of days. Pt had some recent tests performed, is concerned about the results of an HIV test.

## 2018-01-23 NOTE — ED Triage Notes (Signed)
Pt was here this am for the same

## 2018-01-24 ENCOUNTER — Emergency Department (HOSPITAL_COMMUNITY)
Admission: EM | Admit: 2018-01-24 | Discharge: 2018-01-24 | Disposition: A | Discharge status: Home / Self Care | Payer: Self-pay | Attending: Emergency Medicine | Admitting: Emergency Medicine

## 2018-01-24 DIAGNOSIS — F411 Generalized anxiety disorder: Secondary | ICD-10-CM

## 2018-01-24 DIAGNOSIS — F430 Acute stress reaction: Secondary | ICD-10-CM

## 2018-01-24 LAB — I-STAT CHEM 8, ED
BUN: 8 mg/dL (ref 6–20)
Calcium, Ion: 1.22 mmol/L (ref 1.15–1.40)
Chloride: 100 mmol/L — ABNORMAL LOW (ref 101–111)
Creatinine, Ser: 1.2 mg/dL (ref 0.61–1.24)
Glucose, Bld: 89 mg/dL (ref 65–99)
HCT: 41 % (ref 39.0–52.0)
Hemoglobin: 13.9 g/dL (ref 13.0–17.0)
Potassium: 3.7 mmol/L (ref 3.5–5.1)
Sodium: 141 mmol/L (ref 135–145)
TCO2: 30 mmol/L (ref 22–32)

## 2018-01-24 LAB — I-STAT TROPONIN, ED: Troponin i, poc: 0 ng/mL (ref 0.00–0.08)

## 2018-01-24 MED ORDER — HYDROXYZINE HCL 25 MG PO TABS: 25.0000 mg | ORAL_TABLET | Freq: Three times a day (TID) | ORAL | 0 refills | Status: DC | PRN

## 2018-01-24 NOTE — ED Notes (Signed)
EKG given to EDP,Molpus,MD. For review. 

## 2018-01-24 NOTE — ED Notes (Signed)
Pt states he had dry mouth, felt like it was hard to move his mouth, weakness in his hands and felt light headed earlier  Pt states symptoms are resolving  Pt was here earlier today for same  States now he just has a bad taste in his mouth

## 2018-01-24 NOTE — ED Provider Notes (Signed)
[facility] DEPT Provider Note   CSN: [id number] Arrival date & time: 01/23/18  2143     History   Chief Complaint Chief Complaint  Patient presents with  . Numbness    HPI Paul Beasley is a 27 y.o. male.   27 year old male presents to the emergency department for evaluation of lightheadedness.  He states that he was lying down to go to bed tonight when he began to feel increasingly lightheaded.  He has had movements of intermittent generalized weakness as well as the dry mouth.  He also reports some paresthesias to his face and around his lips.  He has had increasing stress and anxiety since realizing that 1 of his sexual partners had HIV with an undetectable viral load.  He has had initial laboratory workup at urgent care which was reassuring.  He is supposed to follow-up with an infectious disease specialist.  He denies any fevers, syncope, nausea, vomiting, leg swelling.  He is feeling slightly better at this time in comparison to arrival.     Past Medical History:  Diagnosis Date  . Migraine   . Testicular torsion     Patient Active Problem List   Diagnosis Date Noted  . Frequent urination 12/29/2017  . Urethritis 12/29/2017  . 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 Elevated lactic acid level 07/02/2016  . AKI (acute kidney injury) ([place]) 07/02/2016  . Sepsis ([place]) 07/02/2016  . Tinea versicolor 06/21/2016  . URI (upper respiratory infection) 11/21/2015  . Routine general medical examination at a health care facility 01/22/2015    Past Surgical History:  Procedure Laterality Date  . ORCHIECTOMY Bilateral 06/24/2016   Procedure: SCROTAL EXPLORATION, RIGHT ORCHIOPEXY, LEFT ORCHIECTOMY;  Surgeon: Franchot Gallo, MD;  Location: [facility];  Service: Urology;  Laterality: Bilateral;  . WISDOM TOOTH EXTRACTION          Home Medications    Prior to Admission medications   Medication Sig Start Date End Date Taking? Authorizing Provider    hydrOXYzine (ATARAX/VISTARIL) 25 MG tablet Take 1 tablet (25 mg total) by mouth every 8 (eight) hours as needed for anxiety. 01/24/18   Antonietta Breach, PA-C    Family History Family History  Problem Relation Age of Onset  . Hypertension Mother   . Hypertension Paternal Grandmother   . Hypertension Paternal Grandfather   . Healthy Sister     Social History Social History   Tobacco Use  . Smoking status: Never Smoker  . Smokeless tobacco: Never Used  Substance Use Topics  . Alcohol use: No    Alcohol/week: 0.0 oz  . Drug use: No     Allergies   Patient has no known allergies.   Review of Systems Review of Systems Ten systems reviewed and are negative for acute change, except as noted in the HPI.    Physical Exam Updated Vital Signs BP (!) 130/94 (BP Location: Left Arm)   Pulse 74   Temp 97.9 F (36.6 C) (Oral)   Resp 14   SpO2 99%   Physical Exam  Constitutional: He is oriented to person, place, and time. He appears well-developed and well-nourished. No distress.  Nontoxic and in NAD  HENT:  Head: Normocephalic and atraumatic.  Eyes: Conjunctivae and EOM are normal. No scleral icterus.  Neck: Normal range of motion.  Cardiovascular: Normal rate, regular rhythm and intact distal pulses.  Patient not tachycardic as noted in triage  Pulmonary/Chest: Effort normal. No stridor. No respiratory distress. He has no wheezes.  Lungs  clear to auscultation bilaterally  Abdominal: Soft. He exhibits no distension and no mass. There is no tenderness. There is no guarding.  Soft, nontender, nondistended abdomen  Musculoskeletal: Normal range of motion.  Neurological: He is alert and oriented to person, place, and time. He exhibits normal muscle tone. Coordination normal.  GCS 15.  Moving all extremities.  Skin: Skin is warm and dry. No rash noted. He is not diaphoretic. No erythema. No pallor.  Psychiatric: His behavior is normal. His mood appears anxious.  Nursing note and  vitals reviewed.    ED Treatments / Results  Labs (all labs ordered are listed, but only abnormal results are displayed) Labs Reviewed  I-STAT CHEM 8, ED - Abnormal; Notable for the following components:      Result Value   Chloride 100 (*)    All other components within normal limits  I-STAT TROPONIN, ED    EKG EKG Interpretation  Date/Time:  Wednesday January 24 2018 04:04:13 EDT Ventricular Rate:  61 PR Interval:    QRS Duration: 102 QT Interval:  407 QTC Calculation: 410 R Axis:   85 Text Interpretation:  Sinus rhythm Probable left atrial enlargement RSR' in V1 or V2, probably normal variant ST elevation suggests acute pericarditis Rate is slower Confirmed by Shanon Rosser [phone]) on 01/24/2018 4:07:32 AM   Radiology No results found.  Procedures Procedures (including critical care time)  Medications Ordered in ED Medications - No data to display   Initial Impression / Assessment and Plan / ED Course  I have reviewed the triage vital signs and the nursing notes.  Pertinent labs & imaging results that were available during my care of the patient were reviewed by me and considered in my medical decision making (see chart for details).     Patient presents to the emergency department for myriad of symptoms which began tonight while he was trying to sleep.  He reports worsening stress and anxiety after realizing that he had an unprotected sexual encounter with an HIV patient with undetected viral load.  He has had negative outpatient workup thus far for this and is supposed to follow-up with infectious disease, but feels as though he continues to lament on the potential for future abnormal results.  The patient has no complaints of pain during my assessment.  Lungs sound clear.  Heart regular rate and rhythm.  He has a further reassuring laboratory evaluation today.  Stable orthostatic vital signs.  No acute ischemic changes on EKG.  Patient noted to be tachycardic on  arrival.  This has resolved spontaneously.  I suspect tachycardia to be secondary to worsening anxiety.  Will discharge with short course of hydroxyzine for management of anxiety symptoms in the future.  I do not believe further emergent evaluation is indicated at this time.  Return precautions discussed and provided. Patient discharged in stable condition with no unaddressed concerns.   Final Clinical Impressions(s) / ED Diagnoses   Final diagnoses:  Anxiety in acute stress reaction    ED Discharge Orders        Ordered    hydrOXYzine (ATARAX/VISTARIL) 25 MG tablet  Every 8 hours PRN     01/24/18 0457       Antonietta Breach, PA-C 01/24/18 0502    Molpus, Jenny Reichmann, MD 01/24/18 [phone]

## 2018-01-30 ENCOUNTER — Other Ambulatory Visit: Payer: Self-pay

## 2018-01-30 ENCOUNTER — Encounter: Payer: Self-pay | Admitting: Family

## 2018-01-30 ENCOUNTER — Ambulatory Visit (INDEPENDENT_AMBULATORY_CARE_PROVIDER_SITE_OTHER): Payer: BLUE CROSS/BLUE SHIELD | Admitting: Family

## 2018-01-30 VITALS — BP 147/92 | HR 76 | Temp 98.1°F | Ht 70.0 in | Wt 145.0 lb

## 2018-01-30 DIAGNOSIS — Z206 Contact with and (suspected) exposure to human immunodeficiency virus [HIV]: Secondary | ICD-10-CM | POA: Insufficient documentation

## 2018-01-30 NOTE — Progress Notes (Signed)
   Date:  01/30/2018   Insured   [x]    Uninsured  []    HPI  Paul Beasley is a 27 y.o. male who is here to see Paul Beasley for potential PrEP.  Demographics Race:  Black or African American [2] Marital Status:  Single  Allergies No Known Allergies  Past Medical History:  Diagnosis Date  . Migraine   . Testicular torsion     Outpatient Encounter Medications as of 01/30/2018  Medication Sig  . hydrOXYzine (ATARAX/VISTARIL) 25 MG tablet Take 1 tablet (25 mg total) by mouth every 8 (eight) hours as needed for anxiety.   No facility-administered encounter medications on file as of 01/30/2018.     Social History   Tobacco Use  Smoking Status Never Smoker  Smokeless Tobacco Never Used   Social History   Substance and Sexual Activity  Alcohol Use No  . Alcohol/week: 0.0 oz    Drug use?   Yes []  No [x]   Injectable drug use?   Yes []     No [x]   Sexual History  Missing doses? Yes []    No  []   No flowsheet data found.  Labs: HIV Lab Results  Component Value Date   HIV Non Reactive 01/16/2018   HIV Non Reactive 07/02/2016    GFR Estimated Creatinine Clearance: 88.3 mL/min (by C-G formula based on SCr of 1.2 mg/dL).  Hepatitis B No results found for: HEPBSAB No results found for: HEPBSAG  Hepatitis C No results found for: HCVAB  Hepatitis A No results found for: HAV  RPR and STI Lab Results  Component Value Date   LABRPR Non Reactive 01/16/2018    STI Results CT  Latest Ref Rng & Units Negative  01/16/2018 Negative  12/29/2017 Negative     Assessment  Paul Beasley has a sexual encounter about a week before early April with a HIV positive male. He is bisexual person. Apparently, the HIV positive partner was undetectable on therapy. A test was done at the visit and was negative. Educating about PrEP today and HIV risk. We are going to get a HIV VL to r/o HIV at this point. A blood test for GC was done at the primary care visit and they were neg as well as his  RPR. He is reluctant to take on PrEP at this visit and would like to think about it before proceeding.    Recommendations   HIV ab/RNA to r/o Potential PrEP in the future

## 2018-01-30 NOTE — Progress Notes (Signed)
Subjective:    Patient ID: Paul Beasley, male    DOB: Apr 16, 1991, 27 y.o.   MRN: [id number]  Chief Complaint  Patient presents with  . New Patient (Initial Visit)    interested in Prep    HPI:  Paul Beasley is a 27 y.o. male who presents today for initial evaluation and treatment of HIV exposure.   Paul Beasley was seen in primary care on 01/16/18 and found that he had unprotected anal intercourse with a male partner who was HIV positive about 1 week prior to presentation. He was told that she is on medications and is "undectectable."  An HIV test on 01/16/18 was negative. At the time he was experiencing no symptoms. He was seen at the ED on 4/10 experiencing increasing lightheadedness, dry mouth, and paresthesias to his face and lips.   Denies fevers, chills, night sweats, headaches, changes in vision, neck pain/stiffness, nausea, diarrhea, vomiting, lesions or rashes.   No Known Allergies    Outpatient Medications Prior to Visit  Medication Sig Dispense Refill  . hydrOXYzine (ATARAX/VISTARIL) 25 MG tablet Take 1 tablet (25 mg total) by mouth every 8 (eight) hours as needed for anxiety. 12 tablet 0   No facility-administered medications prior to visit.      Past Medical History:  Diagnosis Date  . Migraine   . Testicular torsion       Past Surgical History:  Procedure Laterality Date  . ORCHIECTOMY Bilateral 06/24/2016   Procedure: SCROTAL EXPLORATION, RIGHT ORCHIOPEXY, LEFT ORCHIECTOMY;  Surgeon: Marcine MatarStephen Dahlstedt, MD;  Location: [facility];  Service: Urology;  Laterality: Bilateral;  . WISDOM TOOTH EXTRACTION        Family History  Problem Relation Age of Onset  . Hypertension Mother   . Hypertension Paternal Grandmother   . Hypertension Paternal Grandfather   . Healthy Sister       Social History   Socioeconomic History  . Marital status: Single    Spouse name: Not on file  . Number of children: Not on file  . Years of education: Not on file  .  Highest education level: Not on file  Occupational History  . Occupation: [employer]  Social Needs  . Financial resource strain: Not on file  . Food insecurity:    Worry: Not on file    Inability: Not on file  . Transportation needs:    Medical: Not on file    Non-medical: Not on file  Tobacco Use  . Smoking status: Never Smoker  . Smokeless tobacco: Never Used  Substance and Sexual Activity  . Alcohol use: No    Alcohol/week: 0.0 oz  . Drug use: No  . Sexual activity: Yes    Partners: Female, Male    Comment: offered condoms  Lifestyle  . Physical activity:    Days per week: Not on file    Minutes per session: Not on file  . Stress: Not on file  Relationships  . Social connections:    Talks on phone: Not on file    Gets together: Not on file    Attends religious service: Not on file    Active member of club or organization: Not on file    Attends meetings of clubs or organizations: Not on file    Relationship status: Not on file  . Intimate partner violence:    Fear of current or ex partner: Not on file    Emotionally abused: Not on file    Physically abused: Not on  file    Forced sexual activity: Not on file  Other Topics Concern  . Not on file  Social History Narrative  . Not on file      Review of Systems  Constitutional: Negative for activity change, appetite change, diaphoresis, fatigue, fever and unexpected weight change.  HENT: Negative for congestion, sinus pressure and sore throat.   Respiratory: Negative for cough, chest tightness, shortness of breath and wheezing.   Cardiovascular: Negative for chest pain and leg swelling.  Gastrointestinal: Negative for abdominal pain, constipation, diarrhea, nausea and vomiting.  Genitourinary: Negative for dysuria, flank pain, frequency, genital sores, hematuria and urgency.  Neurological: Negative for weakness and headaches.       Objective:    BP (!) 147/92   Pulse 76   Temp 98.1 F (36.7 C) (Oral)    Ht 5\' 10"  (1.778 m)   Wt 145 lb (65.8 kg)   BMI 20.81 kg/m  Nursing note and vital signs reviewed.  Physical Exam  Constitutional: He is oriented to person, place, and time. He appears well-developed. No distress.  HENT:  Mouth/Throat: Oropharynx is clear and moist.  Eyes: Conjunctivae are normal.  Neck: Neck supple.  Cardiovascular: Normal rate, regular rhythm, normal heart sounds and intact distal pulses. Exam reveals no gallop and no friction rub.  No murmur heard. Pulmonary/Chest: Effort normal and breath sounds normal. No respiratory distress. He has no wheezes. He has no rales. He exhibits no tenderness.  Abdominal: Soft. Bowel sounds are normal. There is no tenderness.  Lymphadenopathy:    He has no cervical adenopathy.  Neurological: He is alert and oriented to person, place, and time.  Skin: Skin is warm and dry. No rash noted.  Psychiatric: He has a normal mood and affect. His behavior is normal. Judgment and thought content normal.        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 Previous HIV test negative. Discussed transmission, prevention (including PrEP), and treatments. Will recheck HIV antibody and HIV RNA levels. Pharmacy to discuss PrEP. He was able to speak with our counseling as well.  Follow up pending blood work results.       Relevant Orders   HIV 1 RNA quant-no reflex-bld   HIV antibody (with reflex)       I am having Paul Beasley maintain his hydrOXYzine.   Follow-up: Return if symptoms worsen or fail to improve.  Jeanine Luz, FNP [facility]

## 2018-01-30 NOTE — Assessment & Plan Note (Addendum)
Exposure to HIV through unprotected anal intercourse with a partner that was HIV positive and undetectable on ART. Previous HIV test negative. Discussed transmission, prevention (including PrEP), and treatments. Will recheck HIV antibody and HIV RNA levels. Pharmacy to discuss PrEP. He was able to speak with our counseling as well.  Follow up pending blood work results.

## 2018-01-30 NOTE — Patient Instructions (Signed)
Nice to meet you!  We will check your blood work today.  Plan to follow up pending blood work results.

## 2018-01-31 ENCOUNTER — Encounter (INDEPENDENT_AMBULATORY_CARE_PROVIDER_SITE_OTHER): Payer: Self-pay

## 2018-01-31 LAB — HIV ANTIBODY (ROUTINE TESTING W REFLEX): HIV 1&2 Ab, 4th Generation: NONREACTIVE

## 2018-02-01 ENCOUNTER — Encounter (INDEPENDENT_AMBULATORY_CARE_PROVIDER_SITE_OTHER): Payer: Self-pay

## 2018-02-01 LAB — HIV-1 RNA QUANT-NO REFLEX-BLD
HIV 1 RNA Quant: <20 copies/mL
HIV-1 RNA Quant, Log: <1.3 Log copies/mL

## 2018-02-05 ENCOUNTER — Telehealth: Payer: Self-pay | Admitting: Pharmacist Clinician (PhC)/ Clinical Pharmacy Specialist

## 2018-02-05 NOTE — Telephone Encounter (Signed)
Paul Beasley will come in on Thursday for his PrEP visit. He is confirmed HIV neg now with the HIV VL.

## 2018-02-08 ENCOUNTER — Ambulatory Visit: Payer: Self-pay

## 2018-04-03 ENCOUNTER — Ambulatory Visit (INDEPENDENT_AMBULATORY_CARE_PROVIDER_SITE_OTHER): Payer: BLUE CROSS/BLUE SHIELD | Admitting: Pharmacist Clinician (PhC)/ Clinical Pharmacy Specialist

## 2018-04-03 ENCOUNTER — Other Ambulatory Visit (HOSPITAL_COMMUNITY)
Admission: RE | Admit: 2018-04-03 | Discharge: 2018-04-03 | Disposition: A | Discharge status: Home / Self Care | Payer: BLUE CROSS/BLUE SHIELD | Source: Ambulatory Visit | Attending: Infectious Disease | Admitting: Infectious Disease

## 2018-04-03 DIAGNOSIS — Z7253 High risk bisexual behavior: Secondary | ICD-10-CM

## 2018-04-03 NOTE — Progress Notes (Signed)
Date:  04/03/2018   Insured   [x]    Uninsured  []    HPI  Paul Beasley is a 27 y.o. male who is here to see pharmacy to start PrEP  Demographics Race:  Black or African American [2] Marital Status:  Single  Allergies No Known Allergies  Past Medical History:  Diagnosis Date  . Migraine   . Testicular torsion     Outpatient Encounter Medications as of 04/03/2018  Medication Sig  . hydrOXYzine (ATARAX/VISTARIL) 25 MG tablet Take 1 tablet (25 mg total) by mouth every 8 (eight) hours as needed for anxiety.   No facility-administered encounter medications on file as of 04/03/2018.     Social History   Tobacco Use  Smoking Status Never Smoker  Smokeless Tobacco Never Used   Social History   Substance and Sexual Activity  Alcohol Use No  . Alcohol/week: 0.0 oz    Drug use?   Yes []  No [x]   Injectable drug use?   Yes []     No [x]   Sexual History  Missing doses? Yes []    No  []   CHL HIV PREP FLOWSHEET RESULTS 04/03/2018  Insurance Status Insured  How did you hear? Referral from primary  Gender at birth Male  Gender identity cis-Male  Risk for HIV Condomless vaginal or anal intercourse  Sex Partners Both men and women  # sex partners past 3-6 mos 4-6  Sex activity preferences Insertive;Oral  Condom use Yes  % condom use 190  Partners genders and ages 63M 20-24;M 25-29;F 20-24;F 25-29  Treated for STI? No  HIV symptoms? Swollen lymph nodes in your neck  PrEP Eligibility HIV negative;CrCl >60 ml/min;Substantial risk for HIV  Paper work received? No    Labs: Creatinine Lab Results  Component Value Date   CREATININE 1.20 01/24/2018   CREATININE 1.22 07/02/2016   CREATININE 1.75 (H) 07/01/2016   CREATININE 1.08 06/24/2016   CREATININE 1.02 01/20/2015    HIV Lab Results  Component Value Date   HIV NON-REACTIVE 01/30/2018   HIV Non Reactive 01/16/2018   HIV Non Reactive 07/02/2016    GFR CrCl cannot be calculated (Patient's most recent lab result is  older than the maximum 21 days allowed.).  Hepatitis B No results found for: HEPBSAB No results found for: HEPBSAG  Hepatitis C No results found for: HCVAB  Hepatitis A No results found for: HAV  RPR and STI Lab Results  Component Value Date   LABRPR Non Reactive 01/16/2018    STI Results CT  Latest Ref Rng & Units Negative  01/16/2018 Negative  12/29/2017 Negative     Assessment  Paul Beasley saw Paul Beasley after a sexual encounter with a HIV positive partner. Apparently, the partner was suppressed on therapy. We did a VL on him and it came back neg. He saw an MD after the exposure but was never start on PEP. We discussed PrEP with him at the visit but he would like to think about it and do more research on it. He is here today to get the process started. He is a bisexual partner (top). He denied a hx of STDs. We will repeat his baseline PrEP today including hep A and B.   Counseled extensively on the importance of adherence. We will see if he needs vaccines once his titers are back. If neg, we will send 30d of TRV.    Recommendations   HIV ab, hep A/B titers, STDs Truvada x 30d if neg F/u in one month  Paul Beasley, PharmD, BCPS, AAHIVP, CPP Infectious Disease Pharmacist Pager: [phone] 04/03/2018 11:53 AM

## 2018-04-04 ENCOUNTER — Telehealth: Payer: Self-pay | Admitting: Pharmacist Clinician (PhC)/ Clinical Pharmacy Specialist

## 2018-04-04 LAB — URINE CYTOLOGY ANCILLARY ONLY
Chlamydia: NEGATIVE
Neisseria Gonorrhea: NEGATIVE

## 2018-04-04 LAB — CYTOLOGY, (ORAL, ANAL, URETHRAL) ANCILLARY ONLY
Chlamydia: NEGATIVE
Chlamydia: NEGATIVE
Neisseria Gonorrhea: NEGATIVE
Neisseria Gonorrhea: NEGATIVE

## 2018-04-04 LAB — BASIC METABOLIC PANEL
BUN: 9 mg/dL (ref 7–25)
CO2: 29 mmol/L (ref 20–32)
Calcium: 9.6 mg/dL (ref 8.6–10.3)
Chloride: 100 mmol/L (ref 98–110)
Creat: 1.03 mg/dL (ref 0.60–1.35)
Glucose, Bld: 83 mg/dL (ref 65–99)
Potassium: 4.1 mmol/L (ref 3.5–5.3)
Sodium: 139 mmol/L (ref 135–146)

## 2018-04-04 LAB — HIV ANTIBODY (ROUTINE TESTING W REFLEX): HIV 1&2 Ab, 4th Generation: NONREACTIVE

## 2018-04-04 LAB — HEPATITIS A ANTIBODY, TOTAL: Hepatitis A AB,Total: REACTIVE — AB

## 2018-04-04 LAB — HEPATITIS B SURFACE ANTIBODY,QUALITATIVE: Hep B S Ab: NONREACTIVE

## 2018-04-04 LAB — RPR: RPR Ser Ql: NONREACTIVE

## 2018-04-04 LAB — HEPATITIS B SURFACE ANTIGEN: Hepatitis B Surface Ag: NONREACTIVE

## 2018-04-04 MED ORDER — EMTRICITABINE-TENOFOVIR DF 200-300 MG PO TABS: 1.0000 | ORAL_TABLET | Freq: Every day | ORAL | 0 refills | Status: DC

## 2018-04-04 NOTE — Telephone Encounter (Signed)
HIV ab came back neg. Rx has to be sent to [facility]. 30d Truvada

## 2018-04-13 ENCOUNTER — Other Ambulatory Visit: Payer: Self-pay

## 2018-04-13 ENCOUNTER — Encounter: Payer: Self-pay | Admitting: Family Medicine

## 2018-04-13 ENCOUNTER — Ambulatory Visit: Payer: Self-pay | Admitting: Family Medicine

## 2018-04-13 VITALS — BP 108/70 | HR 103 | Temp 99.0°F | Resp 16 | Ht 71.26 in | Wt 147.0 lb

## 2018-04-13 DIAGNOSIS — G43009 Migraine without aura, not intractable, without status migrainosus: Secondary | ICD-10-CM

## 2018-04-13 DIAGNOSIS — R590 Localized enlarged lymph nodes: Secondary | ICD-10-CM

## 2018-04-13 DIAGNOSIS — B349 Viral infection, unspecified: Secondary | ICD-10-CM | POA: Diagnosis not present

## 2018-04-13 LAB — POCT CBC
Granulocyte percent: 61.2 %G (ref 37–80)
HCT, POC: 44.1 % (ref 43.5–53.7)
Hemoglobin: 15 g/dL (ref 14.1–18.1)
Lymph, poc: 1.7 (ref 0.6–3.4)
MCH, POC: 28.6 pg (ref 27–31.2)
MCHC: 33.9 g/dL (ref 31.8–35.4)
MCV: 84.3 fL (ref 80–97)
MID (cbc): 0.2 (ref 0–0.9)
MPV: 6.9 fL (ref 0–99.8)
POC Granulocyte: 2.9 (ref 2–6.9)
POC LYMPH PERCENT: 34.9 %L (ref 10–50)
POC MID %: 3.9 %M (ref 0–12)
Platelet Count, POC: 353 10*3/uL (ref 142–424)
RBC: 5.23 M/uL (ref 4.69–6.13)
RDW, POC: 12.6 %
WBC: 4.8 10*3/uL (ref 4.6–10.2)

## 2018-04-13 LAB — POCT RAPID STREP A (OFFICE): Rapid Strep A Screen: NEGATIVE

## 2018-04-13 NOTE — Progress Notes (Signed)
Patient ID: Paul StandsWilliam Beasley, male    DOB: 1991-02-18  Age: 27 y.o. MRN: [id number]  Chief Complaint  Patient presents with  . Migraine    with some fatigue x 2 weeks, pt states he has been feeling Nausea and blotiting   . Swollen lympnodes    Subjective:   27 year old male here with a history of having a migraine for the last couple weeks.  He has some fatigue associated with that.  He also has some nausea and bloating sensation in his upper abdomen.  No vomiting.  He has not been running any fever.  He does not smoke.  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  He has a history of migraines in the past, but it is not typical for him him to have them continuing to hang on.  He has noticed swelling of the glands in his neck.  He had some swelling on the right then on the left and its big and swollen on the left now.  No major URI symptoms.  Current allergies, medications, problem list, past/family and social histories reviewed.  Objective:  BP 108/70   Pulse (!) 103   Temp 99 F (37.2 C) (Oral)   Resp 16   Ht 5' 11.26" (1.81 m)   Wt 147 lb (66.7 kg)   SpO2 98%   BMI 20.35 kg/m   No acute distress.  Alert and oriented.  TMs normal.  Eyes PRL.  Fundi benign and disks flat.  Throat minimally erythematous but no exudate or edema.  Neck supple with only small nodes on the right but fairly large left submandibular nodes, along band which probably represents several nodes together rather than one single 1.  Chest is clear to auscultation.  Heart regular without murmurs.  Abdomen soft without mass or tenderness.  Assessment & Plan:   Assessment: 1. Cervical adenopathy   2. Migraine without aura and without status migrainosus, not intractable   3. Viral syndrome       Plan: Strep is negative.  This [place] like a viral illness.  I think that is what is causing his headaches.  Treat symptomatically.  Orders Placed This Encounter  Procedures  . Culture, Group A Strep   Order Specific Question:   Source    Answer:   throat  . Epstein-Barr virus VCA antibody panel  . POCT CBC  . POCT rapid strep A    No orders of the defined types were placed in this encounter.  Results for orders placed or performed in visit on 04/13/18  POCT CBC  Result Value Ref Range   WBC 4.8 4.6 - 10.2 K/uL   Lymph, poc 1.7 0.6 - 3.4   POC LYMPH PERCENT 34.9 10 - 50 %L   MID (cbc) 0.2 0 - 0.9   POC MID % 3.9 0 - 12 %M   POC Granulocyte 2.9 2 - 6.9   Granulocyte percent 61.2 37 - 80 %G   RBC 5.23 4.69 - 6.13 M/uL   Hemoglobin 15.0 14.1 - 18.1 g/dL   HCT, POC 40.944.1 81.143.5 - 53.7 %   MCV 84.3 80 - 97 fL   MCH, POC 28.6 27 - 31.2 pg   MCHC 33.9 31.8 - 35.4 g/dL   RDW, POC 91.412.6 %   Platelet Count, POC 353 142 - 424 K/uL   MPV 6.9 0 - 99.8 fL  POCT rapid strep A  Result Value Ref Range   Rapid Strep A Screen  Negative Negative         Patient Instructions    Make sure you drink plenty of fluids and stay well-hydrated.  This is probably a viral illness that will have to run its course.  The lab work for mono (infectious mononucleosis) is pending.  Your throat strep test was negative.  Take over-the-counter ibuprofen 200 mg 3 pills 2-3 times daily to try and break the cycle of the headaches and discomfort.  Return if not improving  If you do not hear from labs by midweek please contact us.   IF you received an x-ray today, you will receive an invoice from [facility]. Please contact [facility] at [phone] with questions or concerns regarding your invoice.   IF you received labwork today, you will receive an invoice from [facility]. Please contact [facility] at [phone] with questions or concerns regarding your invoice.   Our billing staff will not be able to assist you with questions regarding bills from these companies.  You will be contacted with the lab results as soon as they are available. The fastest way to get your results is to  activate your My Chart account. Instructions are located on the last page of this paperwork. If you have not heard from [place] regarding the results in 2 weeks, please contact this office.         Return if symptoms worsen or fail to improve.   Janace Hoard, MD 04/13/2018

## 2018-04-13 NOTE — Patient Instructions (Addendum)
  Make sure you drink plenty of fluids and stay well-hydrated.  This is probably a viral illness that will have to run its course.  The lab work for mono (infectious mononucleosis) is pending.  Your throat strep test was negative.  Take over-the-counter ibuprofen 200 mg 3 pills 2-3 times daily to try and break the cycle of the headaches and discomfort.  Return if not improving  If you do not hear from labs by midweek please contact us.   IF you received an x-ray today, you will receive an invoice from [facility]. Please contact [facility] at [phone] with questions or concerns regarding your invoice.   IF you received labwork today, you will receive an invoice from [facility]. Please contact [facility] at [phone] with questions or concerns regarding your invoice.   Our billing staff will not be able to assist you with questions regarding bills from these companies.  You will be contacted with the lab results as soon as they are available. The fastest way to get your results is to activate your My Chart account. Instructions are located on the last page of this paperwork. If you have not heard from us regarding the results in 2 weeks, please contact this office.

## 2018-04-14 LAB — EPSTEIN-BARR VIRUS VCA ANTIBODY PANEL
EBV Early Antigen Ab, IgG: <9 U/mL (ref 0.0–8.9)
EBV NA IgG: 22.4 U/mL — ABNORMAL HIGH (ref 0.0–17.9)
EBV VCA IgG: 53.8 U/mL — ABNORMAL HIGH (ref 0.0–17.9)
EBV VCA IgM: 131 U/mL — ABNORMAL HIGH (ref 0.0–35.9)

## 2018-04-15 LAB — CULTURE, GROUP A STREP

## 2018-04-16 ENCOUNTER — Other Ambulatory Visit: Payer: Self-pay | Admitting: Family Medicine

## 2018-04-16 MED ORDER — PENICILLIN V POTASSIUM 500 MG PO TABS: 500.0000 mg | ORAL_TABLET | Freq: Three times a day (TID) | ORAL | 0 refills | Status: AC

## 2018-04-17 ENCOUNTER — Encounter: Payer: Self-pay | Admitting: Family Medicine

## 2018-04-18 ENCOUNTER — Telehealth: Payer: Self-pay | Admitting: *Deleted

## 2018-04-18 NOTE — Telephone Encounter (Signed)
Result note read to patient; verbalizes understanding.   Telephone encounter done as result note was not routed to [facility]. 

## 2018-04-19 ENCOUNTER — Encounter: Payer: Self-pay | Admitting: Family Medicine

## 2018-05-03 ENCOUNTER — Ambulatory Visit (INDEPENDENT_AMBULATORY_CARE_PROVIDER_SITE_OTHER): Payer: BLUE CROSS/BLUE SHIELD | Admitting: Pharmacist Clinician (PhC)/ Clinical Pharmacy Specialist

## 2018-05-03 DIAGNOSIS — Z7253 High risk bisexual behavior: Secondary | ICD-10-CM

## 2018-05-03 DIAGNOSIS — Z23 Encounter for immunization: Secondary | ICD-10-CM | POA: Diagnosis not present

## 2018-05-03 NOTE — Progress Notes (Signed)
Date:  05/03/2018   Insured   [x]    Uninsured  []    HPI  Paul Beasley is a 27 y.o. male who is here for his 1 mo PrEP visit with pharmacy.   Demographics Race:  Black or African American [2] Marital Status:  Single  Allergies No Known Allergies  Past Medical History:  Diagnosis Date  . Migraine   . Testicular torsion     Outpatient Encounter Medications as of 05/03/2018  Medication Sig  . emtricitabine-tenofovir (TRUVADA) 200-300 MG tablet Take 1 tablet by mouth daily.  . Zinc 50 MG TABS Take 1 tablet by mouth daily.  . [DISCONTINUED] hydrOXYzine (ATARAX/VISTARIL) 25 MG tablet Take 1 tablet (25 mg total) by mouth every 8 (eight) hours as needed for anxiety.   No facility-administered encounter medications on file as of 05/03/2018.     Social History   Tobacco Use  Smoking Status Never Smoker  Smokeless Tobacco Never Used   Social History   Substance and Sexual Activity  Alcohol Use No  . Alcohol/week: 0.0 oz    Drug use?   Yes []  No [x]   Injectable drug use?   Yes []     No [x]   Sexual History  Missing doses? Yes []    No  [x]   CHL HIV PREP FLOWSHEET RESULTS 05/03/2018 04/03/2018  Insurance Status Insured Insured  How did you hear? Primary referral Referral from primary  Gender at birth Male Male  Gender identity cis-Male cis-Male  Risk for HIV In sexual relationship with HIV+ partner;Condomless vaginal or anal intercourse;>5 partners in past 6 mos (regardless of condom use) Condomless vaginal or anal intercourse  Sex Partners Both men and women Both men and women  # sex partners past 3-6 mos 1-3 4-6  Sex activity preferences Insertive;Oral Insertive;Oral  Condom use Yes Yes  % condom use 67 28  Partners genders and ages 80 20-24;M 25-29;F 30-49;F 25-29;F 20-24;M 30-49 M 20-24;M 25-29;F 20-24;F 25-29  Treated for STI? No No  HIV symptoms? N/A Swollen lymph nodes in your neck  PrEP Eligibility HIV negative;CrCl >60 ml/min;Substantial risk for HIV HIV  negative;CrCl >60 ml/min;Substantial risk for HIV  Paper work received? No No    Labs: Creatinine Lab Results  Component Value Date   CREATININE 1.03 04/03/2018   CREATININE 1.20 01/24/2018   CREATININE 1.22 07/02/2016   CREATININE 1.75 (H) 07/01/2016   CREATININE 1.08 06/24/2016    HIV Lab Results  Component Value Date   HIV NON-REACTIVE 04/03/2018   HIV NON-REACTIVE 01/30/2018   HIV Non Reactive 01/16/2018   HIV Non Reactive 07/02/2016    GFR CrCl cannot be calculated (Patient's most recent lab result is older than the maximum 21 days allowed.).  Hepatitis B Lab Results  Component Value Date   HEPBSAB NON-REACTIVE 04/03/2018   Lab Results  Component Value Date   HEPBSAG NON-REACTIVE 04/03/2018    Hepatitis C No results found for: HCVAB  Hepatitis A Lab Results  Component Value Date   HAV REACTIVE (A) 04/03/2018    RPR and STI Lab Results  Component Value Date   LABRPR NON-REACTIVE 04/03/2018   LABRPR Non Reactive 01/16/2018    STI Results GC CT  04/03/2018 Negative Negative  04/03/2018 Negative Negative  04/03/2018 Negative Negative  01/16/2018 - Negative  12/29/2017 - Negative     Assessment  Paul Beasley is here today for his 1 mo PrEP visit. He has not missed any doses of his Truvada. He dose have some nausea early on  but it has subsided. He has not had any sexual encounter since the last visit. His condom use is pretty good and usually around 90%. Condom wasn't used at that one encounter where he had an encounter with a suppressed HIV partner. He wasn't placed on PEP. He decided to start PrEP after some thoughts.   We will just do HIV ab and bmet today. He is hep B sab neg so we will start the series. [facility] can fill his Truvada now. If he is neg, he would like the Truvada to be mailed.    Recommendations   HIV ab, bmet today Hep B #1 Three months of Truvada if neg  Paul SouthwardMinh Beasley, PharmD, BellBCIDP, AAHIVP, CPP Infectious Disease Pharmacist Pager:  [phone] 05/03/2018 11:51 AM

## 2018-05-04 ENCOUNTER — Telehealth: Payer: Self-pay | Admitting: Pharmacist Clinician (PhC)/ Clinical Pharmacy Specialist

## 2018-05-04 LAB — BASIC METABOLIC PANEL
BUN: 15 mg/dL (ref 7–25)
CO2: 29 mmol/L (ref 20–32)
Calcium: 10.1 mg/dL (ref 8.6–10.3)
Chloride: 99 mmol/L (ref 98–110)
Creat: 1.14 mg/dL (ref 0.60–1.35)
Glucose, Bld: 93 mg/dL (ref 65–99)
Potassium: 4.5 mmol/L (ref 3.5–5.3)
Sodium: 138 mmol/L (ref 135–146)

## 2018-05-04 LAB — HIV ANTIBODY (ROUTINE TESTING W REFLEX): HIV 1&2 Ab, 4th Generation: NONREACTIVE

## 2018-05-04 MED ORDER — EMTRICITABINE-TENOFOVIR DF 200-300 MG PO TABS: 1.0000 | ORAL_TABLET | Freq: Every day | ORAL | 2 refills | Status: DC

## 2018-05-04 NOTE — Telephone Encounter (Signed)
HIV ab neg so 3 months of Truvada. [facility] will mail to him.

## 2018-05-07 MED FILL — TRUVADA 200-300 MG TABS: 200-300 | 30 days supply | Qty: 30 | Fill #0

## 2018-05-31 MED FILL — TRUVADA 200-300 MG TABS: 200-300 | 30 days supply | Qty: 30 | Fill #1

## 2018-06-04 ENCOUNTER — Ambulatory Visit (INDEPENDENT_AMBULATORY_CARE_PROVIDER_SITE_OTHER): Payer: BLUE CROSS/BLUE SHIELD

## 2018-06-04 DIAGNOSIS — Z23 Encounter for immunization: Secondary | ICD-10-CM

## 2018-06-04 DIAGNOSIS — B20 Human immunodeficiency virus [HIV] disease: Secondary | ICD-10-CM | POA: Diagnosis not present

## 2018-06-28 MED FILL — TRUVADA 200-300 MG TABS: 200-300 | 30 days supply | Qty: 30 | Fill #2

## 2018-07-31 ENCOUNTER — Ambulatory Visit (INDEPENDENT_AMBULATORY_CARE_PROVIDER_SITE_OTHER): Payer: BLUE CROSS/BLUE SHIELD | Admitting: Pharmacist

## 2018-07-31 DIAGNOSIS — Z23 Encounter for immunization: Secondary | ICD-10-CM

## 2018-07-31 DIAGNOSIS — B20 Human immunodeficiency virus [HIV] disease: Secondary | ICD-10-CM | POA: Diagnosis not present

## 2018-07-31 NOTE — Progress Notes (Signed)
Date:  07/31/2018   HPI: Paul Beasley is a 27 y.o. male presenting for 3 month PrEP follow up.  Insured   [x]    Uninsured  []    Patient Active Problem List   Diagnosis Date Noted  . 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 Frequent urination 12/29/2017  . Urethritis 12/29/2017  . 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 Elevated lactic acid level 07/02/2016  . AKI (acute kidney injury) (HCC) 07/02/2016  . Sepsis (HCC) 07/02/2016  . Tinea versicolor 06/21/2016  . URI (upper respiratory infection) 11/21/2015  . Routine general medical examination at a health care facility 01/22/2015    Patient's Medications  New Prescriptions   No medications on file  Previous Medications   EMTRICITABINE-TENOFOVIR (TRUVADA) 200-300 MG TABLET    Take 1 tablet by mouth daily.   ZINC 50 MG TABS    Take 1 tablet by mouth daily.  Modified Medications   No medications on file  Discontinued Medications   No medications on file    Allergies: No Known Allergies  Past Medical History: Past Medical History:  Diagnosis Date  . Migraine   . Testicular torsion     Social History: Social History   Socioeconomic History  . Marital status: Single    Spouse name: Not on file  . Number of children: Not on file  . Years of education: Not on file  . Highest education level: Not on file  Occupational History  . Occupation: Chief Technology Officer  Social Needs  . Financial resource strain: Not on file  . Food insecurity:    Worry: Not on file    Inability: Not on file  . Transportation needs:    Medical: Not on file    Non-medical: Not on file  Tobacco Use  . Smoking status: Never Smoker  . Smokeless tobacco: Never Used  Substance and Sexual Activity  . Alcohol use: No    Alcohol/week: 0.0 standard drinks  . Drug use: No  . Sexual activity: Yes    Partners: Female, Male    Comment: offered condoms  Lifestyle  . Physical activity:    Days per week: Not on file    Minutes per session: Not on file  . Stress: Not  on file  Relationships  . Social connections:    Talks on phone: Not on file    Gets together: Not on file    Attends religious service: Not on file    Active member of club or organization: Not on file    Attends meetings of clubs or organizations: Not on file    Relationship status: Not on file  Other Topics Concern  . Not on file  Social History Narrative  . Not on file    [facility] HIV PREP FLOWSHEET RESULTS 07/31/2018 05/03/2018 04/03/2018  Insurance Status Insured Insured Insured  How did you hear? - Primary referral Referral from primary  Gender at birth Male Male Male  Gender identity cis-Male cis-Male cis-Male  Risk for HIV In sexual relationship with HIV+ partner;Condomless vaginal or anal intercourse;>5 partners in past 6 mos (regardless of condom use) In sexual relationship with HIV+ partner;Condomless vaginal or anal intercourse;>5 partners in past 6 mos (regardless of condom use) Condomless vaginal or anal intercourse  Sex Partners Both men and women Both men and women Both men and women  # sex partners past 3-6 mos 1-3 1-3 4-6  Sex activity preferences Insertive;Oral Insertive;Oral Insertive;Oral  Condom use Yes Yes Yes  % condom use 100 90 90  Partners genders and ages - 26 20-24;M 25-29;F 30-49;F 25-29;F 20-24;M 30-49 M 20-24;M 25-29;F 20-24;F 25-29  Treated for STI? No No No  HIV symptoms? N/A N/A Swollen lymph nodes in your neck  PrEP Eligibility HIV negative;CrCl >60 ml/min;Substantial risk for HIV HIV negative;CrCl >60 ml/min;Substantial risk for HIV HIV negative;CrCl >60 ml/min;Substantial risk for HIV  Paper work received? - No No    Labs:  SCr: Lab Results  Component Value Date   CREATININE 1.14 05/03/2018   CREATININE 1.03 04/03/2018   CREATININE 1.20 01/24/2018   CREATININE 1.22 07/02/2016   CREATININE 1.75 (H) 07/01/2016   HIV Lab Results  Component Value Date   HIV NON-REACTIVE 05/03/2018   HIV NON-REACTIVE 04/03/2018   HIV NON-REACTIVE 01/30/2018     HIV Non Reactive 01/16/2018   HIV Non Reactive 07/02/2016   Hepatitis B Lab Results  Component Value Date   HEPBSAB NON-REACTIVE 04/03/2018   HEPBSAG NON-REACTIVE 04/03/2018   Hepatitis C No results found for: HEPCAB, HCVRNAPCRQN Hepatitis A Lab Results  Component Value Date   HAV REACTIVE (A) 04/03/2018   RPR and STI Lab Results  Component Value Date   LABRPR NON-REACTIVE 04/03/2018   LABRPR Non Reactive 01/16/2018    STI Results GC CT  04/03/2018 Negative Negative  04/03/2018 Negative Negative  04/03/2018 Negative Negative  01/16/2018 - Negative  12/29/2017 - Negative    Assessment: Paul Beasley is here for his 3 month PrEP follow up visit. He reports doing well on Truvada. Initially, he was having issues with nausea that started soon after taking Truvada and continued throughout the day. He started taking Truvada at night which helped and he has noticed that the nausea has subsided the longer that he has been on medication. We offered to write a prescription for Zofran PRN, but Benjy declined. He gets his Truvada in the mail and was without medication for 2-3 days in July due to a late delivery. He reports no missed doses aside from this incident. He has had one partner since his last visit and reports using condoms 100% of the time. He has no s/sx of STDs or acute HIV infection. We discussed the approval of Descovy for PrEP and the safety benefits of this medication as compared to Truvada. [name] agreed to switch to Descovy. He was also offered a flu shot today, which he accepted.  Plan: HIV Ab 3 months Descovy if negative Flu shot Next f/u on 1/14 at 1130  [name] N. Zigmund Daniel, PharmD PGY2 Infectious Diseases Pharmacy Resident Phone: [phone] 07/31/2018 11:36 AM

## 2018-08-01 ENCOUNTER — Telehealth: Payer: Self-pay | Admitting: Pharmacist

## 2018-08-01 DIAGNOSIS — Z7253 High risk bisexual behavior: Secondary | ICD-10-CM

## 2018-08-01 LAB — HIV ANTIBODY (ROUTINE TESTING W REFLEX): HIV 1&2 Ab, 4th Generation: NONREACTIVE

## 2018-08-01 MED ORDER — EMTRICITABINE-TENOFOVIR AF 200-25 MG PO TABS: 1.0000 | ORAL_TABLET | Freq: Every day | ORAL | 2 refills | Status: DC

## 2018-08-01 NOTE — Telephone Encounter (Addendum)
Called patient to let him know that his HIV antibody was negative. No answer, had to leave a VM.  Will send in 3 months of Descovy to [facility].

## 2018-08-09 MED FILL — DESCOVY 200-25 MG TABS: 200-25 | 30 days supply | Qty: 30 | Fill #0

## 2018-08-30 DIAGNOSIS — B081 Molluscum contagiosum: Secondary | ICD-10-CM | POA: Diagnosis not present

## 2018-09-06 MED FILL — DESCOVY 200-25 MG TABS: 200-25 | 30 days supply | Qty: 30 | Fill #1

## 2018-10-05 MED FILL — DESCOVY 200-25 MG TABS: 200-25 | 30 days supply | Qty: 30 | Fill #2

## 2018-10-30 ENCOUNTER — Ambulatory Visit: Payer: BLUE CROSS/BLUE SHIELD

## 2018-11-30 ENCOUNTER — Ambulatory Visit (INDEPENDENT_AMBULATORY_CARE_PROVIDER_SITE_OTHER): Payer: BLUE CROSS/BLUE SHIELD | Admitting: Pharmacist

## 2018-11-30 ENCOUNTER — Other Ambulatory Visit (HOSPITAL_COMMUNITY)
Admission: RE | Admit: 2018-11-30 | Discharge: 2018-11-30 | Disposition: A | Discharge status: Home / Self Care | Payer: BLUE CROSS/BLUE SHIELD | Source: Ambulatory Visit | Attending: Family | Admitting: Family

## 2018-11-30 DIAGNOSIS — Z79899 Other long term (current) drug therapy: Secondary | ICD-10-CM | POA: Diagnosis not present

## 2018-11-30 DIAGNOSIS — Z7253 High risk bisexual behavior: Secondary | ICD-10-CM | POA: Insufficient documentation

## 2018-11-30 DIAGNOSIS — Z23 Encounter for immunization: Secondary | ICD-10-CM | POA: Diagnosis not present

## 2018-11-30 NOTE — Progress Notes (Signed)
Date:  11/30/2018   HPI: Paul Beasley is a 28 y.o. male who presents to the [facility] pharmacy clinic for 3 month PrEP follow-up.  Insured   [x]    Uninsured  []    Patient Active Problem List   Diagnosis Date Noted  . 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 Frequent urination 12/29/2017  . Urethritis 12/29/2017  . 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 Elevated lactic acid level 07/02/2016  . AKI (acute kidney injury) (HCC) 07/02/2016  . Sepsis (HCC) 07/02/2016  . Tinea versicolor 06/21/2016  . URI (upper respiratory infection) 11/21/2015  . Routine general medical examination at a health care facility 01/22/2015    Patient's Medications  New Prescriptions   No medications on file  Previous Medications   EMTRICITABINE-TENOFOVIR AF (DESCOVY) 200-25 MG TABLET    Take 1 tablet by mouth daily.   ZINC 50 MG TABS    Take 1 tablet by mouth daily.  Modified Medications   No medications on file  Discontinued Medications   No medications on file    Allergies: No Known Allergies  Past Medical History: Past Medical History:  Diagnosis Date  . Migraine   . Testicular torsion     Social History: Social History   Socioeconomic History  . Marital status: Single    Spouse name: Not on file  . Number of children: Not on file  . Years of education: Not on file  . Highest education level: Not on file  Occupational History  . Occupation: Chief Technology Officer  Social Needs  . Financial resource strain: Not on file  . Food insecurity:    Worry: Not on file    Inability: Not on file  . Transportation needs:    Medical: Not on file    Non-medical: Not on file  Tobacco Use  . Smoking status: Never Smoker  . Smokeless tobacco: Never Used  Substance and Sexual Activity  . Alcohol use: No    Alcohol/week: 0.0 standard drinks  . Drug use: No  . Sexual activity: Yes    Partners: Female, Male    Comment: offered condoms  Lifestyle  . Physical activity:    Days per week: Not on file    Minutes per  session: Not on file  . Stress: Not on file  Relationships  . Social connections:    Talks on phone: Not on file    Gets together: Not on file    Attends religious service: Not on file    Active member of club or organization: Not on file    Attends meetings of clubs or organizations: Not on file    Relationship status: Not on file  Other Topics Concern  . Not on file  Social History Narrative  . Not on file    [facility] HIV PREP FLOWSHEET RESULTS 11/30/2018 07/31/2018 05/03/2018 04/03/2018  Insurance Status Insured Insured Insured Insured  How did you hear? - - Primary referral Referral from primary  Gender at birth Male Male Male Male  Gender identity cis-Male cis-Male cis-Male cis-Male  Risk for HIV >5 partners in past 6 mos (regardless of condom use);Condomless vaginal or anal intercourse In sexual relationship with HIV+ partner;Condomless vaginal or anal intercourse;>5 partners in past 6 mos (regardless of condom use) In sexual relationship with HIV+ partner;Condomless vaginal or anal intercourse;>5 partners in past 6 mos (regardless of condom use) Condomless vaginal or anal intercourse  Sex Partners Both men and women Both men and women Both men and women Both men and women  #  sex partners past 3-6 mos 1-3 1-3 1-3 4-6  Sex activity preferences Insertive;Oral Insertive;Oral Insertive;Oral Insertive;Oral  Condom use Yes Yes Yes Yes  % condom use 100 100 90 90  Partners genders and ages - - 62 20-24;M 25-29;F 30-49;F 25-29;F 20-24;M 30-49 M 20-24;M 25-29;F 20-24;F 25-29  Treated for STI? No No No No  HIV symptoms? N/A N/A N/A Swollen lymph nodes in your neck  PrEP Eligibility Substantial risk for HIV HIV negative;CrCl >60 ml/min;Substantial risk for HIV HIV negative;CrCl >60 ml/min;Substantial risk for HIV HIV negative;CrCl >60 ml/min;Substantial risk for HIV  Paper work received? - - No No    Labs:  SCr: Lab Results  Component Value Date   CREATININE 1.14 05/03/2018   CREATININE  1.03 04/03/2018   CREATININE 1.20 01/24/2018   CREATININE 1.22 07/02/2016   CREATININE 1.75 (H) 07/01/2016   HIV Lab Results  Component Value Date   HIV NON-REACTIVE 07/31/2018   HIV NON-REACTIVE 05/03/2018   HIV NON-REACTIVE 04/03/2018   HIV NON-REACTIVE 01/30/2018   HIV Non Reactive 01/16/2018   Hepatitis B Lab Results  Component Value Date   HEPBSAB NON-REACTIVE 04/03/2018   HEPBSAG NON-REACTIVE 04/03/2018   Hepatitis C No results found for: HEPCAB, HCVRNAPCRQN Hepatitis A Lab Results  Component Value Date   HAV REACTIVE (A) 04/03/2018   RPR and STI Lab Results  Component Value Date   LABRPR NON-REACTIVE 04/03/2018   LABRPR Non Reactive 01/16/2018    STI Results GC CT  04/03/2018 Negative Negative  04/03/2018 Negative Negative  04/03/2018 Negative Negative  01/16/2018 - Negative  12/29/2017 - Negative    Assessment: Paul Beasley is here today for his 3 month PrEP follow-up appointment and lab work.  He was switched to Descovy at the last office visit and is doing well after the switch. He hasn't noticed any issues or side effects from Descovy and the nausea has subsided. He missed his follow-up appointment in January because his insurance ran out last year and was not active until 11/17/2018. He has been out of Descovy for about 1-2 weeks.   He has had 2 partners since his last visit with 100% condom use.  He has sex with both men and women.  He is a top partner with oral involvement as well.  No issues today or s/sx of STDs or acute HIV. Will check a HIV antibody, BMET, and STD cytology today as it has been several months since we checked for STDs. Will see him back in 3 months.  He asked if we gave the HPV vaccine here.  He had his first one at a [facility] back in mid November but it wasn't covered on his insurance so he wanted to wait until this insurance was active to get his other ones.  He is also due for his 3rd and final Hepatitis B vaccine, so will give that  today also.  It is ok to mail his Descovy from [facility] when his labs come back.  Plan: - HIV antibody, BMET, RPR, urine/oral gonorrhea/chlamydia cytology - HPV vaccine #2/3, Hepatitis B vaccine #3/3 - Descovy x 3 months if HIV negative - F/u in 3 months  Cassie L. Kuppelweiser, PharmD, BCIDP, AAHIVP, CPP Infectious Diseases Clinical Pharmacist [facility] 11/30/2018, 12:01 PM

## 2018-12-03 ENCOUNTER — Telehealth: Payer: Self-pay | Admitting: Pharmacist

## 2018-12-03 DIAGNOSIS — Z7253 High risk bisexual behavior: Secondary | ICD-10-CM

## 2018-12-03 LAB — BASIC METABOLIC PANEL
BUN: 10 mg/dL (ref 7–25)
CO2: 30 mmol/L (ref 20–32)
Calcium: 9.8 mg/dL (ref 8.6–10.3)
Chloride: 103 mmol/L (ref 98–110)
Creat: 1.12 mg/dL (ref 0.60–1.35)
Glucose, Bld: 88 mg/dL (ref 65–99)
Potassium: 4.3 mmol/L (ref 3.5–5.3)
Sodium: 142 mmol/L (ref 135–146)

## 2018-12-03 LAB — CYTOLOGY, (ORAL, ANAL, URETHRAL) ANCILLARY ONLY
Chlamydia: NEGATIVE
Neisseria Gonorrhea: POSITIVE — AB

## 2018-12-03 LAB — RPR: RPR Ser Ql: NONREACTIVE

## 2018-12-03 LAB — URINE CYTOLOGY ANCILLARY ONLY
Chlamydia: NEGATIVE
Neisseria Gonorrhea: NEGATIVE

## 2018-12-03 LAB — HIV ANTIBODY (ROUTINE TESTING W REFLEX): HIV 1&2 Ab, 4th Generation: NONREACTIVE

## 2018-12-03 MED ORDER — EMTRICITABINE-TENOFOVIR AF 200-25 MG PO TABS: 1.0000 | ORAL_TABLET | Freq: Every day | ORAL | 2 refills | Status: DC

## 2018-12-03 MED FILL — DESCOVY 200-25 MG TABS: 200-25 | 30 days supply | Qty: 30 | Fill #0

## 2018-12-03 NOTE — Telephone Encounter (Signed)
Called patient to let him know that his HIV antibody was negative.  Will send in 3 more months of Descovy.  

## 2018-12-04 ENCOUNTER — Telehealth: Payer: Self-pay | Admitting: Pharmacist

## 2018-12-04 ENCOUNTER — Ambulatory Visit (INDEPENDENT_AMBULATORY_CARE_PROVIDER_SITE_OTHER): Payer: BLUE CROSS/BLUE SHIELD | Admitting: *Deleted

## 2018-12-04 DIAGNOSIS — A64 Unspecified sexually transmitted disease: Secondary | ICD-10-CM | POA: Diagnosis not present

## 2018-12-04 MED ORDER — AZITHROMYCIN 250 MG PO TABS
1000.0000 mg | ORAL_TABLET | Freq: Once | ORAL | Status: AC
  Administered 2018-12-04: 1000 mg via ORAL

## 2018-12-04 MED ORDER — CEFTRIAXONE SODIUM 250 MG IJ SOLR
250.0000 mg | Freq: Once | INTRAMUSCULAR | Status: AC
  Administered 2018-12-04: 250 mg via INTRAMUSCULAR

## 2018-12-04 NOTE — Telephone Encounter (Signed)
Patient's oral swab came back positive for gonorrhea.  Called to relay results.  Asked him to abstain from any sexual activity for 7-10 days and to have his partners get tested and treated.  He was shocked of the results and had a few questions which were answered.  He will come in today at 2pm to see our nurses. He will need:  - Azithromycin 1 gm PO x 1 - Ceftriaxone 250 mg IM x 1

## 2018-12-31 MED FILL — DESCOVY 200-25 MG TABS: 200-25 | 30 days supply | Qty: 30 | Fill #1

## 2019-01-28 MED FILL — DESCOVY 200-25 MG TABS: 200-25 | 30 days supply | Qty: 30 | Fill #2

## 2019-02-22 ENCOUNTER — Ambulatory Visit: Payer: BLUE CROSS/BLUE SHIELD | Admitting: Pharmacist

## 2019-02-27 ENCOUNTER — Ambulatory Visit (INDEPENDENT_AMBULATORY_CARE_PROVIDER_SITE_OTHER): Payer: BLUE CROSS/BLUE SHIELD | Admitting: Pharmacist

## 2019-02-27 ENCOUNTER — Other Ambulatory Visit: Payer: Self-pay

## 2019-02-27 DIAGNOSIS — Z23 Encounter for immunization: Secondary | ICD-10-CM

## 2019-02-27 DIAGNOSIS — Z7253 High risk bisexual behavior: Secondary | ICD-10-CM

## 2019-02-27 NOTE — Progress Notes (Signed)
Date:  02/28/2019   HPI: Paul Beasley is a 28 y.o. male who presents to the [facility] pharmacy clinic for 3 month PrEP follow-up.  Insured   [x]    Uninsured  []    Patient Active Problem List   Diagnosis Date Noted  . 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 Frequent urination 12/29/2017  . Urethritis 12/29/2017  . 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 Elevated lactic acid level 07/02/2016  . AKI (acute kidney injury) (HCC) 07/02/2016  . Sepsis (HCC) 07/02/2016  . Tinea versicolor 06/21/2016  . URI (upper respiratory infection) 11/21/2015  . Routine general medical examination at a health care facility 01/22/2015    Patient's Medications  New Prescriptions   No medications on file  Previous Medications   ZINC 50 MG TABS    Take 1 tablet by mouth daily.  Modified Medications   Modified Medication Previous Medication   EMTRICITABINE-TENOFOVIR AF (DESCOVY) 200-25 MG TABLET emtricitabine-tenofovir AF (DESCOVY) 200-25 MG tablet      Take 1 tablet by mouth daily.    Take 1 tablet by mouth daily.  Discontinued Medications   No medications on file    Allergies: No Known Allergies  Past Medical History: Past Medical History:  Diagnosis Date  . Migraine   . Testicular torsion     Social History: Social History   Socioeconomic History  . Marital status: Single    Spouse name: Not on file  . Number of children: Not on file  . Years of education: Not on file  . Highest education level: Not on file  Occupational History  . Occupation: Chief Technology Officer  Social Needs  . Financial resource strain: Not on file  . Food insecurity:    Worry: Not on file    Inability: Not on file  . Transportation needs:    Medical: Not on file    Non-medical: Not on file  Tobacco Use  . Smoking status: Never Smoker  . Smokeless tobacco: Never Used  Substance and Sexual Activity  . Alcohol use: No    Alcohol/week: 0.0 standard drinks  . Drug use: No  . Sexual activity: Yes    Partners: Female, Male   Comment: offered condoms  Lifestyle  . Physical activity:    Days per week: Not on file    Minutes per session: Not on file  . Stress: Not on file  Relationships  . Social connections:    Talks on phone: Not on file    Gets together: Not on file    Attends religious service: Not on file    Active member of club or organization: Not on file    Attends meetings of clubs or organizations: Not on file    Relationship status: Not on file  Other Topics Concern  . Not on file  Social History Narrative  . Not on file    [facility] HIV PREP FLOWSHEET RESULTS 02/28/2019 11/30/2018 07/31/2018 05/03/2018 04/03/2018  Insurance Status Insured Insured Insured Insured Insured  How did you hear? - - - Primary referral Referral from primary  Gender at birth Male Male Male Male Male  Gender identity cis-Male cis-Male cis-Male cis-Male cis-Male  Risk for HIV >5 partners in past 6 mos (regardless of condom use);Hx of STI;Condomless vaginal or anal intercourse >5 partners in past 6 mos (regardless of condom use);Condomless vaginal or anal intercourse In sexual relationship with HIV+ partner;Condomless vaginal or anal intercourse;>5 partners in past 6 mos (regardless of condom use) In sexual relationship with HIV+ partner;Condomless vaginal or  anal intercourse;>5 partners in past 6 mos (regardless of condom use) Condomless vaginal or anal intercourse  Sex Partners Both men and women Both men and women Both men and women Both men and women Both men and women  # sex partners past 3-6 mos 1-3 1-3 1-3 1-3 4-6  Sex activity preferences Insertive;Oral Insertive;Oral Insertive;Oral Insertive;Oral Insertive;Oral  Condom use Yes Yes Yes Yes Yes  % condom use 90 100 100 90 90  Partners genders and ages - - - 11M 20-24;M 25-29;F 30-49;F 25-29;F 20-24;M 30-49 M 20-24;M 25-29;F 20-24;F 25-29  Treated for STI? No No No No No  HIV symptoms? N/A N/A N/A N/A Swollen lymph nodes in your neck  PrEP Eligibility Substantial risk for HIV  Substantial risk for HIV HIV negative;CrCl >60 ml/min;Substantial risk for HIV HIV negative;CrCl >60 ml/min;Substantial risk for HIV HIV negative;CrCl >60 ml/min;Substantial risk for HIV  Paper work received? - - - No No    Labs:  SCr: Lab Results  Component Value Date   CREATININE 1.12 11/30/2018   CREATININE 1.14 05/03/2018   CREATININE 1.03 04/03/2018   CREATININE 1.20 01/24/2018   CREATININE 1.22 07/02/2016   HIV Lab Results  Component Value Date   HIV NON-REACTIVE 02/27/2019   HIV NON-REACTIVE 11/30/2018   HIV NON-REACTIVE 07/31/2018   HIV NON-REACTIVE 05/03/2018   HIV NON-REACTIVE 04/03/2018   Hepatitis B Lab Results  Component Value Date   HEPBSAB REACTIVE (A) 02/27/2019   HEPBSAG NON-REACTIVE 04/03/2018   Hepatitis C No results found for: HEPCAB, HCVRNAPCRQN Hepatitis A Lab Results  Component Value Date   HAV REACTIVE (A) 04/03/2018   RPR and STI Lab Results  Component Value Date   LABRPR NON-REACTIVE 11/30/2018   LABRPR NON-REACTIVE 04/03/2018   LABRPR Non Reactive 01/16/2018    STI Results GC CT  11/30/2018 Negative Negative  11/30/2018 **POSITIVE**(A) Negative  04/03/2018 Negative Negative  04/03/2018 Negative Negative  04/03/2018 Negative Negative  01/16/2018 - Negative  12/29/2017 - Negative    Assessment: Paul Beasley is here today for his 3 month PrEP follow-up and lab appointment.  He continues on Descovy with no issues and no missed doses. No problems getting it from the pharmacy.  He has been less sexually active since the COVID 19 pandemic and has no issues or concerns today.  No changes in his insurance since I last saw him. Will check a HIV antibody only and see him back in 3 months.   He also received his 3rd and final HPV vaccine today. He also finished his Hepatitis B vaccine series, so I will check for immunity today.  Plan: - HIV antibody today - Descovy x 3 months if HIV negative  - HPV vaccine #3/3 - Hepatitis B surface antibody - F/u  with me again 8/13 at 1130am  [name], PharmD, BCIDP, AAHIVP, CPP Infectious Diseases Clinical Pharmacist [facility] 02/28/2019, 1:40 PM

## 2019-02-28 ENCOUNTER — Telehealth: Payer: Self-pay | Admitting: Pharmacist

## 2019-02-28 LAB — HIV ANTIBODY (ROUTINE TESTING W REFLEX): HIV 1&2 Ab, 4th Generation: NONREACTIVE

## 2019-02-28 LAB — HEPATITIS B SURFACE ANTIBODY,QUALITATIVE: Hep B S Ab: REACTIVE — AB

## 2019-02-28 MED ORDER — EMTRICITABINE-TENOFOVIR AF 200-25 MG PO TABS: 1.0000 | ORAL_TABLET | Freq: Every day | ORAL | 2 refills | Status: DC

## 2019-02-28 MED FILL — DESCOVY 200-25 MG TABS: 200-25 | 30 days supply | Qty: 30 | Fill #0

## 2019-02-28 NOTE — Telephone Encounter (Signed)
Called patient to let him know that his HIV antibody was negative - no answer.  Will send in 3 more months of Descovy.  

## 2019-04-08 MED FILL — DESCOVY 200-25 MG TABS: 200-25 | 30 days supply | Qty: 30 | Fill #1

## 2019-04-16 DIAGNOSIS — Z0000 Encounter for general adult medical examination without abnormal findings: Secondary | ICD-10-CM | POA: Diagnosis not present

## 2019-04-16 DIAGNOSIS — Z131 Encounter for screening for diabetes mellitus: Secondary | ICD-10-CM | POA: Diagnosis not present

## 2019-04-16 DIAGNOSIS — L659 Nonscarring hair loss, unspecified: Secondary | ICD-10-CM | POA: Diagnosis not present

## 2019-04-16 DIAGNOSIS — Z8249 Family history of ischemic heart disease and other diseases of the circulatory system: Secondary | ICD-10-CM | POA: Diagnosis not present

## 2019-04-16 DIAGNOSIS — Z23 Encounter for immunization: Secondary | ICD-10-CM | POA: Diagnosis not present

## 2019-05-04 DIAGNOSIS — Z202 Contact with and (suspected) exposure to infections with a predominantly sexual mode of transmission: Secondary | ICD-10-CM | POA: Diagnosis not present

## 2019-05-04 DIAGNOSIS — R300 Dysuria: Secondary | ICD-10-CM | POA: Diagnosis not present

## 2019-05-14 MED FILL — DESCOVY 200-25 MG TABS: 200-25 | 30 days supply | Qty: 30 | Fill #2

## 2019-05-30 ENCOUNTER — Ambulatory Visit (INDEPENDENT_AMBULATORY_CARE_PROVIDER_SITE_OTHER): Payer: BC Managed Care – PPO | Admitting: Pharmacist

## 2019-05-30 ENCOUNTER — Other Ambulatory Visit (HOSPITAL_COMMUNITY)
Admission: RE | Admit: 2019-05-30 | Discharge: 2019-05-30 | Disposition: A | Discharge status: Home / Self Care | Payer: BC Managed Care – PPO | Source: Ambulatory Visit | Attending: Family | Admitting: Family

## 2019-05-30 ENCOUNTER — Other Ambulatory Visit: Payer: Self-pay

## 2019-05-30 DIAGNOSIS — Z7253 High risk bisexual behavior: Secondary | ICD-10-CM | POA: Insufficient documentation

## 2019-05-30 DIAGNOSIS — Z206 Contact with and (suspected) exposure to human immunodeficiency virus [HIV]: Secondary | ICD-10-CM | POA: Diagnosis not present

## 2019-05-30 NOTE — Progress Notes (Signed)
Date:  05/31/2019   HPI: Paul Beasley is a 28 y.o. male who presents to the [place] [facility] for 3 month PrEP follow-up.  Insured   [x]    Uninsured  []    Patient Active Problem List   Diagnosis Date Noted  . 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 Frequent urination 12/29/2017  . Urethritis 12/29/2017  . 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 Elevated lactic acid level 07/02/2016  . AKI (acute kidney injury) ([place]) 07/02/2016  . Sepsis ([place]) 07/02/2016  . Tinea versicolor 06/21/2016  . URI (upper respiratory infection) 11/21/2015  . Routine general medical examination at a health care facility 01/22/2015    Patient's Medications  New Prescriptions   No medications on file  Previous Medications   EMTRICITABINE-TENOFOVIR AF (DESCOVY) 200-25 MG TABLET    Take 1 tablet by mouth daily.   ZINC 50 MG TABS    Take 1 tablet by mouth daily.  Modified Medications   No medications on file  Discontinued Medications   No medications on file    Allergies: No Known Allergies  Past Medical History: Past Medical History:  Diagnosis Date  . Migraine   . Testicular torsion     Social History: Social History   Socioeconomic History  . Marital status: Single    Spouse name: Not on file  . Number of children: Not on file  . Years of education: Not on file  . Highest education level: Not on file  Occupational History  . Occupation: Pension scheme manager  Social Needs  . Financial resource strain: Not on file  . Food insecurity    Worry: Not on file    Inability: Not on file  . Transportation needs    Medical: Not on file    Non-medical: Not on file  Tobacco Use  . Smoking status: Never Smoker  . Smokeless tobacco: Never Used  Substance and Sexual Activity  . Alcohol use: No    Alcohol/week: 0.0 standard drinks  . Drug use: No  . Sexual activity: Yes    Partners: Female, Male    Comment: offered condoms  Lifestyle  . Physical activity    Days per week: Not on file    Minutes per session:  Not on file  . Stress: Not on file  Relationships  . Social Herbalist on phone: Not on file    Gets together: Not on file    Attends religious service: Not on file    Active member of club or organization: Not on file    Attends meetings of clubs or organizations: Not on file    Relationship status: Not on file  Other Topics Concern  . Not on file  Social History Narrative  . Not on file    [facility] HIV PREP FLOWSHEET RESULTS 05/31/2019 02/28/2019 11/30/2018 07/31/2018 05/03/2018 04/03/2018  Insurance Status Insured Insured Insured Insured Insured Insured  How did you hear? - - - - Primary referral Referral from primary  Gender at birth Male Male Male Male Male Male  Gender identity cis-Male cis-Male cis-Male cis-Male cis-Male cis-Male  Risk for HIV Condomless vaginal or anal intercourse;Hx of STI >5 partners in past 6 mos (regardless of condom use);Hx of STI;Condomless vaginal or anal intercourse >5 partners in past 6 mos (regardless of condom use);Condomless vaginal or anal intercourse In sexual relationship with HIV+ partner;Condomless vaginal or anal intercourse;>5 partners in past 6 mos (regardless of condom use) In sexual relationship with HIV+ partner;Condomless vaginal or anal intercourse;>5 partners  in past 6 mos (regardless of condom use) Condomless vaginal or anal intercourse  Sex Partners Both men and women Both men and women Both men and women Both men and women Both men and women Both men and women  # sex partners past 3-6 mos 1-3 1-3 1-3 1-3 1-3 4-6  Sex activity preferences Insertive and receptive;Oral Insertive;Oral Insertive;Oral Insertive;Oral Insertive;Oral Insertive;Oral  Condom use Yes Yes Yes Yes Yes Yes  % condom use 100 90 100 100 90 90  Partners genders and ages - - - 25- M 20-24;M 25-29;F 30-49;F 25-29;F 20-24;M 30-49 M 20-24;M 25-29;F 20-24;F 25-29  Treated for STI? No No No No No No  HIV symptoms? N/A N/A N/A N/A N/A Swollen lymph nodes in your neck  PrEP  Eligibility Substantial risk for HIV Substantial risk for HIV Substantial risk for HIV HIV negative;CrCl >60 ml/min;Substantial risk for HIV HIV negative;CrCl >60 ml/min;Substantial risk for HIV HIV negative;CrCl >60 ml/min;Substantial risk for HIV  Paper work received? - - - - No No    Labs:  SCr: Lab Results  Component Value Date   CREATININE 0.99 05/30/2019   CREATININE 1.12 11/30/2018   CREATININE 1.14 05/03/2018   CREATININE 1.03 04/03/2018   CREATININE 1.20 01/24/2018   HIV Lab Results  Component Value Date   HIV NON-REACTIVE 05/30/2019   HIV NON-REACTIVE 02/27/2019   HIV NON-REACTIVE 11/30/2018   HIV NON-REACTIVE 07/31/2018   HIV NON-REACTIVE 05/03/2018   Hepatitis B Lab Results  Component Value Date   HEPBSAB REACTIVE (A) 02/27/2019   HEPBSAG NON-REACTIVE 04/03/2018   Hepatitis C No results found for: HEPCAB, HCVRNAPCRQN Hepatitis A Lab Results  Component Value Date   HAV REACTIVE (A) 04/03/2018   RPR and STI Lab Results  Component Value Date   LABRPR NON-REACTIVE 05/30/2019   LABRPR NON-REACTIVE 11/30/2018   LABRPR NON-REACTIVE 04/03/2018   LABRPR Non Reactive 01/16/2018    STI Results GC CT  11/30/2018 Negative Negative  11/30/2018 **POSITIVE**(A) Negative  04/03/2018 Negative Negative  04/03/2018 Negative Negative  04/03/2018 Negative Negative  01/16/2018 - Negative  12/29/2017 - Negative    Assessment: Paul Beasley is here today for his PrEP follow-up appointment.  He is still taking Descovy every day with no missed doses or side effects.  He is complaining of hair loss and is wondering if that is a side effect of the Descovy.  I explained that it wasn't to my knowledge and he then said that he used to wear his hair long and now has it really short so he isn't used to it.  No problems getting the Descovy from Paul Beasley and no recent changes in his insurance. He has just had one partner and they use condoms. No concerns for STDs or acute  HIV today.  Will update his labs today including STD panels and see him back in 3 months.  Plan: - HIV antibody, BMET, RPR, urine/oral/rectal gonorrhea/chlamydia cytology today -Descovy x 3 months if HIV negative - F/u with me again 11/17 at 1130am  Cassie L. Kuppelweiser, PharmD, BCIDP, AAHIVP, CPP Infectious Diseases Clinical Pharmacist [facility] 05/31/2019, 1:48 PM

## 2019-05-31 ENCOUNTER — Other Ambulatory Visit: Payer: Self-pay | Admitting: Pharmacist

## 2019-05-31 DIAGNOSIS — Z7253 High risk bisexual behavior: Secondary | ICD-10-CM

## 2019-05-31 LAB — BASIC METABOLIC PANEL
BUN: 10 mg/dL (ref 7–25)
CO2: 31 mmol/L (ref 20–32)
Calcium: 9.9 mg/dL (ref 8.6–10.3)
Chloride: 102 mmol/L (ref 98–110)
Creat: 0.99 mg/dL (ref 0.60–1.35)
Glucose, Bld: 87 mg/dL (ref 65–99)
Potassium: 4.2 mmol/L (ref 3.5–5.3)
Sodium: 140 mmol/L (ref 135–146)

## 2019-05-31 LAB — RPR: RPR Ser Ql: NONREACTIVE

## 2019-05-31 LAB — HIV ANTIBODY (ROUTINE TESTING W REFLEX): HIV 1&2 Ab, 4th Generation: NONREACTIVE

## 2019-05-31 MED ORDER — DESCOVY 200-25 MG PO TABS: 1.0000 | ORAL_TABLET | Freq: Every day | ORAL | 2 refills | Status: DC

## 2019-05-31 NOTE — Progress Notes (Signed)
Patient's HIV antibody is negative.  Will send in 3 more months of Descovy to [facility].  

## 2019-06-01 LAB — CYTOLOGY, (ORAL, ANAL, URETHRAL) ANCILLARY ONLY
Chlamydia: NEGATIVE
Chlamydia: NEGATIVE
Neisseria Gonorrhea: NEGATIVE
Neisseria Gonorrhea: NEGATIVE

## 2019-06-01 LAB — URINE CYTOLOGY ANCILLARY ONLY
Chlamydia: NEGATIVE
Neisseria Gonorrhea: NEGATIVE

## 2019-06-10 ENCOUNTER — Other Ambulatory Visit: Payer: Self-pay

## 2019-06-10 DIAGNOSIS — Z20822 Contact with and (suspected) exposure to covid-19: Secondary | ICD-10-CM

## 2019-06-10 DIAGNOSIS — R6889 Other general symptoms and signs: Secondary | ICD-10-CM | POA: Diagnosis not present

## 2019-06-11 LAB — NOVEL CORONAVIRUS, NAA: SARS-CoV-2, NAA: NOT DETECTED

## 2019-06-20 ENCOUNTER — Other Ambulatory Visit: Payer: Self-pay

## 2019-06-20 ENCOUNTER — Encounter: Payer: Self-pay | Admitting: Emergency Medicine

## 2019-06-20 ENCOUNTER — Ambulatory Visit
Admission: EM | Admit: 2019-06-20 | Discharge: 2019-06-20 | Disposition: A | Discharge status: Home / Self Care | Payer: BC Managed Care – PPO | Attending: Emergency Medicine | Admitting: Emergency Medicine

## 2019-06-20 DIAGNOSIS — Z202 Contact with and (suspected) exposure to infections with a predominantly sexual mode of transmission: Secondary | ICD-10-CM | POA: Diagnosis not present

## 2019-06-20 DIAGNOSIS — Z7251 High risk heterosexual behavior: Secondary | ICD-10-CM | POA: Insufficient documentation

## 2019-06-20 MED ORDER — CEFTRIAXONE SODIUM 250 MG IJ SOLR
250.0000 mg | Freq: Once | INTRAMUSCULAR | Status: AC
  Administered 2019-06-20: 09:00:00 250 mg via INTRAMUSCULAR

## 2019-06-20 MED FILL — DESCOVY 200-25 MG TABS: 200-25 | 30 days supply | Qty: 30 | Fill #0

## 2019-06-20 NOTE — ED Provider Notes (Signed)
[name]    CSN: [id number] Arrival date & time: 06/20/19  0843      History   Chief Complaint Chief Complaint  Patient presents with  . Exposure to STD    HPI Paul Beasley is a 28 y.o. male presenting for annual exposure to gonorrhea.  States that 1 of his partners tested positive, was informed on Tuesday.  Patient reports being sexually active within 1 week prior diagnosis.  Currently on prep for HIV exposure.  Currently sexually active, not using protection: Participating oral, vaginal, anal intercourse.  Patient denies penile or testicular inflammation, pain, lesions.  No penile discharge, urinary frequency/dysuria.   Past Medical History:  Diagnosis Date  . Migraine   . Testicular torsion     Patient Active Problem List   Diagnosis Date Noted  . 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 Frequent urination 12/29/2017  . Urethritis 12/29/2017  . 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 Elevated lactic acid level 07/02/2016  . AKI (acute kidney injury) ([facility]) 07/02/2016  . Sepsis ([facility]) 07/02/2016  . Tinea versicolor 06/21/2016  . URI (upper respiratory infection) 11/21/2015  . Routine general medical examination at a health care facility 01/22/2015    Past Surgical History:  Procedure Laterality Date  . ORCHIECTOMY Bilateral 06/24/2016   Procedure: SCROTAL EXPLORATION, RIGHT ORCHIOPEXY, LEFT ORCHIECTOMY;  Surgeon: Marcine MatarStephen Dahlstedt, MD;  Location: [facility];  Service: Urology;  Laterality: Bilateral;  . WISDOM TOOTH EXTRACTION         Home Medications    Prior to Admission medications   Medication Sig Start Date End Date Taking? Authorizing Provider  Turmeric (QC TUMERIC COMPLEX PO) Take by mouth.   Yes [provider]  emtricitabine-tenofovir AF (DESCOVY) 200-25 MG tablet Take 1 tablet by mouth daily. 05/31/19   Paul Beasley, RPH-CPP  Zinc 50 MG TABS Take 1 tablet by mouth daily.    [provider]    Family History Family History   Problem Relation Age of Onset  . Hypertension Mother   . Hypertension Paternal Grandmother   . Hypertension Paternal Grandfather   . Healthy Sister     Social History Social History   Tobacco Use  . Smoking status: Never Smoker  . Smokeless tobacco: Never Used  Substance Use Topics  . Alcohol use: No    Alcohol/week: 0.0 standard drinks  . Drug use: No     Allergies   Patient has no known allergies.   Review of Systems Review of Systems  Constitutional: Negative for fatigue and fever.  Respiratory: Negative for cough and shortness of breath.   Cardiovascular: Negative for chest pain and palpitations.  Gastrointestinal: Negative for abdominal pain, diarrhea and vomiting.  Genitourinary: Negative for discharge, dysuria, frequency, penile pain, penile swelling, scrotal swelling and testicular pain.  Musculoskeletal: Negative for arthralgias and myalgias.  Skin: Negative for rash and wound.  Neurological: Negative for speech difficulty and headaches.  All other systems reviewed and are negative.    Physical Exam Triage Vital Signs ED Triage Vitals  Enc Vitals Group     BP      Pulse      Resp      Temp      Temp src      SpO2      Weight      Height      Head Circumference      Peak Flow      Pain Score      Pain Loc  Pain Edu?      Excl. in [place]?    No data found.  Updated Vital Signs BP 131/85 (BP Location: Left Arm)   Pulse 66   Temp 98.1 F (36.7 C) (Oral)   Resp 18   SpO2 97%   Visual Acuity Right Eye Distance:   Left Eye Distance:   Bilateral Distance:    Right Eye Near:   Left Eye Near:    Bilateral Near:     Physical Exam Constitutional:      General: He is not in acute distress. HENT:     Head: Normocephalic and atraumatic.  Eyes:     General: No scleral icterus.    Pupils: Pupils are equal, round, and reactive to light.  Cardiovascular:     Rate and Rhythm: Normal rate.  Pulmonary:     Effort: Pulmonary effort is normal.   Genitourinary:    Comments: Circumcised penis without inflamed meatus.  No discharge present.  Swab obtained by provider. Skin:    Coloration: Skin is not jaundiced or pale.  Neurological:     Mental Status: He is alert and oriented to person, place, and time.      UC Treatments / Results  Labs (all labs ordered are listed, but only abnormal results are displayed) Labs Reviewed  CYTOLOGY, (ORAL, ANAL, URETHRAL) ANCILLARY ONLY    EKG   Radiology No results found.  Procedures Procedures (including critical care time)  Medications Ordered in UC Medications  cefTRIAXone (ROCEPHIN) injection 250 mg (has no administration in time range)    Initial Impression / Assessment and Plan / UC Course  I have reviewed the triage vital signs and the nursing notes.  Pertinent labs & imaging results that were available during my care of the patient were reviewed by me and considered in my medical decision making (see chart for details).     1.  Exposure to gonorrhea Patient given Rocephin in office, which he tolerated well.  Cytology pending, will call in any additional medications if needed.  Return precautions discussed, patient verbalized understanding and is agreeable to plan. Final Clinical Impressions(s) / UC Diagnoses   Final diagnoses:  None     Discharge Instructions     Avoid all forms of sexual intercourse (oral, vaginal, anal) for the next 7 days to avoid spreading/reinfecting. Return if symptoms worsen/do not resolve, you develop fever, abdominal pain, blood in your urine, or are re-exposed to an STI.    ED Prescriptions    None     Controlled Substance Prescriptions [place] Controlled Substance Registry consulted? Not Applicable   Paul Beasley, [place] 06/20/19 1610

## 2019-06-20 NOTE — Discharge Instructions (Addendum)
Avoid all forms of sexual intercourse (oral, vaginal, anal) for the next 7 days to avoid spreading/reinfecting. Return if symptoms worsen/do not resolve, you develop fever, abdominal pain, blood in your urine, or are re-exposed to an STI.  

## 2019-06-20 NOTE — ED Notes (Signed)
Patient able to ambulate independently  

## 2019-06-20 NOTE — ED Triage Notes (Signed)
Pt presents to [facility] for assessment after being notified by a partner of a positive chlamydia test.  Wanting to be tested, denies any symptoms.

## 2019-06-22 LAB — CYTOLOGY, (ORAL, ANAL, URETHRAL) ANCILLARY ONLY
Chlamydia: NEGATIVE
Neisseria Gonorrhea: NEGATIVE
Trichomonas: NEGATIVE

## 2019-07-24 MED FILL — DESCOVY 200-25 MG TABS: 200-25 | 30 days supply | Qty: 30 | Fill #1

## 2019-08-27 MED FILL — DESCOVY 200-25 MG TABS: 200-25 | 30 days supply | Qty: 30 | Fill #2

## 2019-09-03 ENCOUNTER — Other Ambulatory Visit: Payer: Self-pay

## 2019-09-03 ENCOUNTER — Ambulatory Visit (INDEPENDENT_AMBULATORY_CARE_PROVIDER_SITE_OTHER): Payer: BC Managed Care – PPO | Admitting: Pharmacist

## 2019-09-03 DIAGNOSIS — Z23 Encounter for immunization: Secondary | ICD-10-CM

## 2019-09-03 DIAGNOSIS — Z79899 Other long term (current) drug therapy: Secondary | ICD-10-CM | POA: Diagnosis not present

## 2019-09-03 DIAGNOSIS — Z7253 High risk bisexual behavior: Secondary | ICD-10-CM

## 2019-09-03 NOTE — Progress Notes (Signed)
Date:  09/03/2019   HPI: Paul Beasley is a 28 y.o. male who presents to the [place] clinic for 3 month PrEP follow-up.  Insured   [x]    Uninsured  []    Patient Active Problem List   Diagnosis Date Noted  . 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 Frequent urination 12/29/2017  . Urethritis 12/29/2017  . 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 Elevated lactic acid level 07/02/2016  . AKI (acute kidney injury) ([place]) 07/02/2016  . Sepsis ([place]) 07/02/2016  . Tinea versicolor 06/21/2016  . URI (upper respiratory infection) 11/21/2015  . Routine general medical examination at a health care facility 01/22/2015    Patient's Medications  New Prescriptions   No medications on file  Previous Medications   EMTRICITABINE-TENOFOVIR AF (DESCOVY) 200-25 MG TABLET    Take 1 tablet by mouth daily.   TURMERIC (QC TUMERIC COMPLEX PO)    Take by mouth.  Modified Medications   No medications on file  Discontinued Medications   No medications on file    Allergies: No Known Allergies  Past Medical History: Past Medical History:  Diagnosis Date  . Migraine   . Testicular torsion     Social History: Social History   Socioeconomic History  . Marital status: Single    Spouse name: Not on file  . Number of children: Not on file  . Years of education: Not on file  . Highest education level: Not on file  Occupational History  . Occupation: Pension scheme manager  Social Needs  . Financial resource strain: Not on file  . Food insecurity    Worry: Not on file    Inability: Not on file  . Transportation needs    Medical: Not on file    Non-medical: Not on file  Tobacco Use  . Smoking status: Never Smoker  . Smokeless tobacco: Never Used  Substance and Sexual Activity  . Alcohol use: No    Alcohol/week: 0.0 standard drinks  . Drug use: No  . Sexual activity: Yes    Partners: Female, Male    Comment: offered condoms  Lifestyle  . Physical activity    Days per week: Not on file    Minutes per  session: Not on file  . Stress: Not on file  Relationships  . Social Herbalist on phone: Not on file    Gets together: Not on file    Attends religious service: Not on file    Active member of club or organization: Not on file    Attends meetings of clubs or organizations: Not on file    Relationship status: Not on file  Other Topics Concern  . Not on file  Social History Narrative  . Not on file    [facility] HIV PREP FLOWSHEET RESULTS 05/31/2019 02/28/2019 11/30/2018 07/31/2018 05/03/2018 04/03/2018  Insurance Status Insured Insured Insured Insured Insured Insured  How did you hear? - - - - Primary referral Referral from primary  Gender at birth Male Male Male Male Male Male  Gender identity cis-Male cis-Male cis-Male cis-Male cis-Male cis-Male  Risk for HIV Condomless vaginal or anal intercourse;Hx of STI >5 partners in past 6 mos (regardless of condom use);Hx of STI;Condomless vaginal or anal intercourse >5 partners in past 6 mos (regardless of condom use);Condomless vaginal or anal intercourse In sexual relationship with HIV+ partner;Condomless vaginal or anal intercourse;>5 partners in past 6 mos (regardless of condom use) In sexual relationship with HIV+ partner;Condomless vaginal or anal intercourse;>5 partners in past  6 mos (regardless of condom use) Condomless vaginal or anal intercourse  Sex Partners Both men and women Both men and women Both men and women Both men and women Both men and women Both men and women  # sex partners past 3-6 mos 1-3 1-3 1-3 1-3 1-3 4-6  Sex activity preferences Insertive and receptive;Oral Insertive;Oral Insertive;Oral Insertive;Oral Insertive;Oral Insertive;Oral  Condom use Yes Yes Yes Yes Yes Yes  % condom use 100 90 100 100 90 90  Partners genders and ages - - - 63 20-24;M 25-29;F 30-49;F 25-29;F 20-24;M 30-49 M 20-24;M 25-29;F 20-24;F 25-29  Treated for STI? No No No No No No  HIV symptoms? N/A N/A N/A N/A N/A Swollen lymph nodes in your neck   PrEP Eligibility Substantial risk for HIV Substantial risk for HIV Substantial risk for HIV HIV negative;CrCl >60 ml/min;Substantial risk for HIV HIV negative;CrCl >60 ml/min;Substantial risk for HIV HIV negative;CrCl >60 ml/min;Substantial risk for HIV  Paper work received? - - - - No No    Labs:  SCr: Lab Results  Component Value Date   CREATININE 0.99 05/30/2019   CREATININE 1.12 11/30/2018   CREATININE 1.14 05/03/2018   CREATININE 1.03 04/03/2018   CREATININE 1.20 01/24/2018   HIV Lab Results  Component Value Date   HIV NON-REACTIVE 05/30/2019   HIV NON-REACTIVE 02/27/2019   HIV NON-REACTIVE 11/30/2018   HIV NON-REACTIVE 07/31/2018   HIV NON-REACTIVE 05/03/2018   Hepatitis B Lab Results  Component Value Date   HEPBSAB REACTIVE (A) 02/27/2019   HEPBSAG NON-REACTIVE 04/03/2018   Hepatitis C No results found for: HEPCAB, HCVRNAPCRQN Hepatitis A Lab Results  Component Value Date   HAV REACTIVE (A) 04/03/2018   RPR and STI Lab Results  Component Value Date   LABRPR NON-REACTIVE 05/30/2019   LABRPR NON-REACTIVE 11/30/2018   LABRPR NON-REACTIVE 04/03/2018   LABRPR Non Reactive 01/16/2018    STI Results GC CT  06/20/2019 Negative Negative  05/30/2019 Negative Negative  05/30/2019 Negative Negative  05/30/2019 Negative Negative  11/30/2018 Negative Negative  11/30/2018 **POSITIVE**(A) Negative  04/03/2018 Negative Negative  04/03/2018 Negative Negative  04/03/2018 Negative Negative  01/16/2018 - Negative  12/29/2017 - Negative    Assessment: Paul Beasley is here for his 3 month PrEP follow up. He is still taking his Descovy everyday with no issues. He was in the emergency department in September for concern about exposure to gonorrhea. He was treated in the ED but was negative. He reports having one partner who is different from the partner he was seeing when we last saw him. They are using condoms and he has no concerns for STDs at this time. We will get labs and see him back  in three months.    Plan: - Continue Descovy if HIV antibody negative - HIV antibody, BMET, Influenza vaccine - Follow up 12/03/2019   Jettie Pagan, PharmD PGY2 Infectious Disease Pharmacy Resident  [facility] 09/03/2019, 11:32 AM

## 2019-09-04 ENCOUNTER — Encounter: Payer: Self-pay | Admitting: Pharmacist

## 2019-09-04 ENCOUNTER — Other Ambulatory Visit: Payer: Self-pay | Admitting: Pharmacist

## 2019-09-04 DIAGNOSIS — Z7253 High risk bisexual behavior: Secondary | ICD-10-CM

## 2019-09-04 LAB — BASIC METABOLIC PANEL
BUN: 12 mg/dL (ref 7–25)
CO2: 29 mmol/L (ref 20–32)
Calcium: 10.3 mg/dL (ref 8.6–10.3)
Chloride: 102 mmol/L (ref 98–110)
Creat: 0.99 mg/dL (ref 0.60–1.35)
Glucose, Bld: 126 mg/dL — ABNORMAL HIGH (ref 65–99)
Potassium: 4.4 mmol/L (ref 3.5–5.3)
Sodium: 141 mmol/L (ref 135–146)

## 2019-09-04 LAB — HIV ANTIBODY (ROUTINE TESTING W REFLEX): HIV 1&2 Ab, 4th Generation: NONREACTIVE

## 2019-09-04 MED ORDER — DESCOVY 200-25 MG PO TABS: 1.0000 | ORAL_TABLET | Freq: Every day | ORAL | 2 refills | Status: DC

## 2019-09-30 MED FILL — DESCOVY 200-25 MG TABS: 200-25 | 30 days supply | Qty: 30 | Fill #0

## 2019-11-02 MED FILL — DESCOVY 200-25 MG TABS: 200-25 | 30 days supply | Qty: 30 | Fill #1

## 2019-12-02 MED FILL — DESCOVY 200-25 MG TABS: 200-25 | 30 days supply | Qty: 30 | Fill #2

## 2019-12-03 ENCOUNTER — Ambulatory Visit (INDEPENDENT_AMBULATORY_CARE_PROVIDER_SITE_OTHER): Payer: BC Managed Care – PPO | Admitting: Pharmacist

## 2019-12-03 ENCOUNTER — Other Ambulatory Visit (HOSPITAL_COMMUNITY)
Admission: RE | Admit: 2019-12-03 | Discharge: 2019-12-03 | Disposition: A | Discharge status: Home / Self Care | Payer: BC Managed Care – PPO | Source: Ambulatory Visit | Attending: Family | Admitting: Family

## 2019-12-03 ENCOUNTER — Other Ambulatory Visit: Payer: Self-pay

## 2019-12-03 DIAGNOSIS — Z7253 High risk bisexual behavior: Secondary | ICD-10-CM | POA: Insufficient documentation

## 2019-12-03 DIAGNOSIS — Z202 Contact with and (suspected) exposure to infections with a predominantly sexual mode of transmission: Secondary | ICD-10-CM

## 2019-12-03 MED ORDER — CEFTRIAXONE SODIUM 500 MG IJ SOLR: 500.0000 mg | Freq: Once | INTRAMUSCULAR | Status: DC

## 2019-12-03 MED ORDER — CEFTRIAXONE SODIUM 500 MG IJ SOLR
500.0000 mg | Freq: Once | INTRAMUSCULAR | Status: AC
  Administered 2019-12-03: 12:00:00 500 mg via INTRAMUSCULAR

## 2019-12-03 NOTE — Progress Notes (Signed)
Date:  12/03/2019   HPI: Paul Beasley is a 29 y.o. male who presents to the [facility] pharmacy clinic for 3 month PrEP follow-up.  Insured   [x]    Uninsured  []    Patient Active Problem List   Diagnosis Date Noted  . 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 Frequent urination 12/29/2017  . Urethritis 12/29/2017  . 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 Elevated lactic acid level 07/02/2016  . AKI (acute kidney injury) (HCC) 07/02/2016  . Sepsis (HCC) 07/02/2016  . Tinea versicolor 06/21/2016  . URI (upper respiratory infection) 11/21/2015  . Routine general medical examination at a health care facility 01/22/2015    Patient's Medications  New Prescriptions   No medications on file  Previous Medications   EMTRICITABINE-TENOFOVIR AF (DESCOVY) 200-25 MG TABLET    Take 1 tablet by mouth daily.   TURMERIC (QC TUMERIC COMPLEX PO)    Take by mouth.  Modified Medications   No medications on file  Discontinued Medications   No medications on file    Allergies: No Known Allergies  Past Medical History: Past Medical History:  Diagnosis Date  . Migraine   . Testicular torsion     Social History: Social History   Socioeconomic History  . Marital status: Single    Spouse name: Not on file  . Number of children: Not on file  . Years of education: Not on file  . Highest education level: Not on file  Occupational History  . Occupation: 01/19/2016  Tobacco Use  . Smoking status: Never Smoker  . Smokeless tobacco: Never Used  Substance and Sexual Activity  . Alcohol use: No    Alcohol/week: 0.0 standard drinks  . Drug use: No  . Sexual activity: Yes    Partners: Female, Male    Comment: offered condoms  Other Topics Concern  . Not on file  Social History Narrative  . Not on file   Social Determinants of Health   Financial Resource Strain:   . Difficulty of Paying Living Expenses: Not on file  Food Insecurity:   . Worried About 03/24/2015 in the Last Year: Not on file  .  Ran Out of Food in the Last Year: Not on file  Transportation Needs:   . Lack of Transportation (Medical): Not on file  . Lack of Transportation (Non-Medical): Not on file  Physical Activity:   . Days of Exercise per Week: Not on file  . Minutes of Exercise per Session: Not on file  Stress:   . Feeling of Stress : Not on file  Social Connections:   . Frequency of Communication with Friends and Family: Not on file  . Frequency of Social Gatherings with Friends and Family: Not on file  . Attends Religious Services: Not on file  . Active Member of Clubs or Organizations: Not on file  . Attends Chief Technology Officer Meetings: Not on file  . Marital Status: Not on file    [facility] HIV PREP FLOWSHEET RESULTS 12/03/2019 05/31/2019 02/28/2019 11/30/2018 07/31/2018 05/03/2018 04/03/2018  Insurance Status Insured Insured Insured Insured Insured Insured Insured  How did you hear? - - - - - Primary referral Referral from primary  Gender at birth Male Male Male Male Male Male Male  Gender identity cis-Male cis-Male cis-Male cis-Male cis-Male cis-Male cis-Male  Risk for HIV - Condomless vaginal or anal intercourse;Hx of STI >5 partners in past 6 mos (regardless of condom use);Hx of STI;Condomless vaginal or anal intercourse >5 partners in past  6 mos (regardless of condom use);Condomless vaginal or anal intercourse In sexual relationship with HIV+ partner;Condomless vaginal or anal intercourse;>5 partners in past 6 mos (regardless of condom use) In sexual relationship with HIV+ partner;Condomless vaginal or anal intercourse;>5 partners in past 6 mos (regardless of condom use) Condomless vaginal or anal intercourse  Sex Partners Both men and women Both men and women Both men and women Both men and women Both men and women Both men and women Both men and women  # sex partners past 3-6 mos 1-3 1-3 1-3 1-3 1-3 1-3 4-6  Sex activity preferences Insertive;Oral Insertive and receptive;Oral Insertive;Oral Insertive;Oral  Insertive;Oral Insertive;Oral Insertive;Oral  Condom use Yes Yes Yes Yes Yes Yes Yes  % condom use 60 100 54 100 100 87 90  Partners genders and ages - - - - 56 20-24;M 25-29;F 30-49;F 25-29;F 20-24;M 30-49 M 20-24;M 25-29;F 20-24;F 25-29  Treated for STI? - No No No No No No  HIV symptoms? - N/A N/A N/A N/A N/A Swollen lymph nodes in your neck  PrEP Eligibility - Substantial risk for HIV Substantial risk for HIV Substantial risk for HIV HIV negative;CrCl >60 ml/min;Substantial risk for HIV HIV negative;CrCl >60 ml/min;Substantial risk for HIV HIV negative;CrCl >60 ml/min;Substantial risk for HIV  Paper work received? - - - - - No No    Labs:  SCr: Lab Results  Component Value Date   CREATININE 0.99 09/03/2019   CREATININE 0.99 05/30/2019   CREATININE 1.12 11/30/2018   CREATININE 1.14 05/03/2018   CREATININE 1.03 04/03/2018   HIV Lab Results  Component Value Date   HIV NON-REACTIVE 09/03/2019   HIV NON-REACTIVE 05/30/2019   HIV NON-REACTIVE 02/27/2019   HIV NON-REACTIVE 11/30/2018   HIV NON-REACTIVE 07/31/2018   Hepatitis B Lab Results  Component Value Date   HEPBSAB REACTIVE (A) 02/27/2019   HEPBSAG NON-REACTIVE 04/03/2018   Hepatitis C No results found for: HEPCAB, HCVRNAPCRQN Hepatitis A Lab Results  Component Value Date   HAV REACTIVE (A) 04/03/2018   RPR and STI Lab Results  Component Value Date   LABRPR NON-REACTIVE 05/30/2019   LABRPR NON-REACTIVE 11/30/2018   LABRPR NON-REACTIVE 04/03/2018   LABRPR Non Reactive 01/16/2018    STI Results GC CT  06/20/2019 Negative Negative  05/30/2019 Negative Negative  05/30/2019 Negative Negative  05/30/2019 Negative Negative  11/30/2018 Negative Negative  11/30/2018 **POSITIVE**(A) Negative  04/03/2018 Negative Negative  04/03/2018 Negative Negative  04/03/2018 Negative Negative  01/16/2018 - Negative  12/29/2017 - Negative    Assessment: Paul Beasley is here for his 3 month PrEP follow-up. He is insured and taking Descovy.  He reports taking it every day. Risk factors: Sex w/ men and women, uses condoms 75%. He has had 2 partners in the past 3 months and is the insertive partner when with men. Discussed vaccinations and patient is up to date. Paul Beasley says a previous partner told him he tested positive for gonorrhea. He had oral sex with this partner about 1 month ago. The patient wants to go ahead and be treated for gonorrhea. We told Paul Beasley we will treat him now but still do STI testing today. Cassie gave the patient ceftriaxone 500 mg IM gluteal injection. We gave the patient condoms and told him to abstain from sex for at least 7 days.   Plan: - Labs: HIV antibody, RPR, urine and mouth cytology - Refill Descovy if HIV antibody is negative - Follow-up with Cassie May 13 at [place], 4th Year PharmD  Candidate

## 2019-12-04 ENCOUNTER — Encounter: Payer: Self-pay | Admitting: Pharmacist

## 2019-12-04 ENCOUNTER — Other Ambulatory Visit: Payer: Self-pay | Admitting: Pharmacist

## 2019-12-04 DIAGNOSIS — Z7253 High risk bisexual behavior: Secondary | ICD-10-CM

## 2019-12-04 LAB — HIV ANTIBODY (ROUTINE TESTING W REFLEX): HIV 1&2 Ab, 4th Generation: NONREACTIVE

## 2019-12-04 LAB — URINE CYTOLOGY ANCILLARY ONLY
Chlamydia: NEGATIVE
Comment: NEGATIVE
Comment: NORMAL
Neisseria Gonorrhea: NEGATIVE

## 2019-12-04 LAB — CYTOLOGY, (ORAL, ANAL, URETHRAL) ANCILLARY ONLY
Chlamydia: NEGATIVE
Comment: NEGATIVE
Comment: NORMAL
Neisseria Gonorrhea: NEGATIVE

## 2019-12-04 LAB — RPR: RPR Ser Ql: NONREACTIVE

## 2019-12-04 MED ORDER — DESCOVY 200-25 MG PO TABS: 1.0000 | ORAL_TABLET | Freq: Every day | ORAL | 2 refills | Status: DC

## 2019-12-04 NOTE — Progress Notes (Signed)
Patient's HIV antibody is negative.  Will send in 3 more months of Descovy to [facility].  

## 2019-12-05 ENCOUNTER — Encounter: Payer: Self-pay | Admitting: Pharmacist

## 2019-12-30 ENCOUNTER — Telehealth: Payer: Self-pay | Admitting: Pharmacy Technician

## 2019-12-30 NOTE — Telephone Encounter (Signed)
[facility] Patient Advocate Encounter  Received notification from [facility] that the patient's [facility] has termed as of 11/17/2019. The 12/02/2019 claim went through which might of been during a grace period. Left a voicemail for the patient to find out if there is new coverage.

## 2020-01-27 MED FILL — DESCOVY 200-25 MG TABS: 200-25 | 30 days supply | Qty: 30 | Fill #0

## 2020-02-19 MED FILL — DESCOVY 200-25 MG TABS: 200-25 | 30 days supply | Qty: 30 | Fill #1

## 2020-02-27 ENCOUNTER — Other Ambulatory Visit: Payer: Self-pay

## 2020-02-27 ENCOUNTER — Ambulatory Visit (INDEPENDENT_AMBULATORY_CARE_PROVIDER_SITE_OTHER): Payer: BC Managed Care – PPO | Admitting: Pharmacist

## 2020-02-27 DIAGNOSIS — Z79899 Other long term (current) drug therapy: Secondary | ICD-10-CM

## 2020-02-27 DIAGNOSIS — Z5181 Encounter for therapeutic drug level monitoring: Secondary | ICD-10-CM

## 2020-02-27 NOTE — Progress Notes (Signed)
Date:  02/27/2020   HPI: Paul Beasley is a 29 y.o. male who presents to the [facility] for 3 month PrEP follow-up.  Insured   [x]    Uninsured  []    Patient Active Problem List   Diagnosis Date Noted  . 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 Frequent urination 12/29/2017  . Urethritis 12/29/2017  . 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 Elevated lactic acid level 07/02/2016  . AKI (acute kidney injury) ([place]) 07/02/2016  . Sepsis ([place]) 07/02/2016  . Tinea versicolor 06/21/2016  . URI (upper respiratory infection) 11/21/2015  . Routine general medical examination at a health care facility 01/22/2015    Patient's Medications  New Prescriptions   No medications on file  Previous Medications   EMTRICITABINE-TENOFOVIR AF (DESCOVY) 200-25 MG TABLET    Take 1 tablet by mouth daily.   TURMERIC (QC TUMERIC COMPLEX PO)    Take by mouth.  Modified Medications   No medications on file  Discontinued Medications   No medications on file    Allergies: No Known Allergies  Past Medical History: Past Medical History:  Diagnosis Date  . Migraine   . Testicular torsion     Social History: Social History   Socioeconomic History  . Marital status: Single    Spouse name: Not on file  . Number of children: Not on file  . Years of education: Not on file  . Highest education level: Not on file  Occupational History  . Occupation: Pension scheme manager  Tobacco Use  . Smoking status: Never Smoker  . Smokeless tobacco: Never Used  Substance and Sexual Activity  . Alcohol use: No    Alcohol/week: 0.0 standard drinks  . Drug use: No  . Sexual activity: Yes    Partners: Female, Male    Comment: offered condoms  Other Topics Concern  . Not on file  Social History Narrative  . Not on file   Social Determinants of Health   Financial Resource Strain:   . Difficulty of Paying Living Expenses:   Food Insecurity:   . Worried About Charity fundraiser in the Last Year:   . Arboriculturist in the  Last Year:   Transportation Needs:   . Film/video editor (Medical):   Marland Kitchen Lack of Transportation (Non-Medical):   Physical Activity:   . Days of Exercise per Week:   . Minutes of Exercise per Session:   Stress:   . Feeling of Stress :   Social Connections:   . Frequency of Communication with Friends and Family:   . Frequency of Social Gatherings with Friends and Family:   . Attends Religious Services:   . Active Member of Clubs or Organizations:   . Attends Archivist Meetings:   Marland Kitchen Marital Status:     [facility] HIV PREP FLOWSHEET RESULTS 12/03/2019 05/31/2019 02/28/2019 11/30/2018 07/31/2018 05/03/2018 04/03/2018  Insurance Status Insured Insured Insured Insured Insured Insured Insured  How did you hear? - - - - - Primary referral Referral from primary  Gender at birth Male Male Male Male Male Male Male  Gender identity cis-Male cis-Male cis-Male cis-Male cis-Male cis-Male cis-Male  Risk for HIV - Condomless vaginal or anal intercourse;Hx of STI >5 partners in past 6 mos (regardless of condom use);Hx of STI;Condomless vaginal or anal intercourse >5 partners in past 6 mos (regardless of condom use);Condomless vaginal or anal intercourse In sexual relationship with HIV+ partner;Condomless vaginal or anal intercourse;>5 partners in past 6 mos (regardless of condom  use) In sexual relationship with HIV+ partner;Condomless vaginal or anal intercourse;>5 partners in past 6 mos (regardless of condom use) Condomless vaginal or anal intercourse  Sex Partners Both men and women Both men and women Both men and women Both men and women Both men and women Both men and women Both men and women  # sex partners past 3-6 mos 1-3 1-3 1-3 1-3 1-3 1-3 4-6  Sex activity preferences Insertive;Oral Insertive and receptive;Oral Insertive;Oral Insertive;Oral Insertive;Oral Insertive;Oral Insertive;Oral  Condom use Yes Yes Yes Yes Yes Yes Yes  % condom use 75 100 90 100 100 90 90  Partners genders and ages - - -  - 52 20-24;M 25-29;F 30-49;F 25-29;F 20-24;M 30-49 M 20-24;M 25-29;F 20-24;F 25-29  Treated for STI? - No No No No No No  HIV symptoms? - N/A N/A N/A N/A N/A Swollen lymph nodes in your neck  PrEP Eligibility - Substantial risk for HIV Substantial risk for HIV Substantial risk for HIV HIV negative;CrCl >60 ml/min;Substantial risk for HIV HIV negative;CrCl >60 ml/min;Substantial risk for HIV HIV negative;CrCl >60 ml/min;Substantial risk for HIV  Paper work received? - - - - - No No    Labs:  SCr: Lab Results  Component Value Date   CREATININE 0.99 09/03/2019   CREATININE 0.99 05/30/2019   CREATININE 1.12 11/30/2018   CREATININE 1.14 05/03/2018   CREATININE 1.03 04/03/2018   HIV Lab Results  Component Value Date   HIV NON-REACTIVE 12/03/2019   HIV NON-REACTIVE 09/03/2019   HIV NON-REACTIVE 05/30/2019   HIV NON-REACTIVE 02/27/2019   HIV NON-REACTIVE 11/30/2018   Hepatitis B Lab Results  Component Value Date   HEPBSAB REACTIVE (A) 02/27/2019   HEPBSAG NON-REACTIVE 04/03/2018   Hepatitis C No results found for: HEPCAB, HCVRNAPCRQN Hepatitis A Lab Results  Component Value Date   HAV REACTIVE (A) 04/03/2018   RPR and STI Lab Results  Component Value Date   LABRPR NON-REACTIVE 12/03/2019   LABRPR NON-REACTIVE 05/30/2019   LABRPR NON-REACTIVE 11/30/2018   LABRPR NON-REACTIVE 04/03/2018   LABRPR Non Reactive 01/16/2018    STI Results GC CT  12/03/2019 Negative Negative  12/03/2019 Negative Negative  06/20/2019 Negative Negative  05/30/2019 Negative Negative  05/30/2019 Negative Negative  05/30/2019 Negative Negative  11/30/2018 Negative Negative  11/30/2018 **POSITIVE**(A) Negative  04/03/2018 Negative Negative  04/03/2018 Negative Negative  04/03/2018 Negative Negative  01/16/2018 - Negative  12/29/2017 - Negative    Assessment: Paul Beasley presents today for his 42-month PrEP follow-up appointment. There were some issues with his insurance which caused him to be off of Descovy  for 2 weeks, however this has now resolved and he was able to pick up his previous refill on May 6th and resumed Descovy with no missed doses. He has no new sexual partners and has not been sexually active. He has been experiencing frequent urination, however with no symptoms of STDs such as burning and discharge, and declined STD checking today. He states that he will be seeing his PCP soon for this. Will get labs today.  Plan: - Labs: HIV antibody and BMET - Descovy x 3 months if HIV antibody is negative - Follow-up with Cassie on August 16th at 11:30AM  Fabio Neighbors, PharmD PGY1 Ambulatory Care Resident [facility]ious [facility] 02/27/2020, 11:43 AM

## 2020-02-28 ENCOUNTER — Encounter (HOSPITAL_COMMUNITY): Payer: Self-pay

## 2020-02-28 ENCOUNTER — Emergency Department (HOSPITAL_COMMUNITY)
Admission: EM | Admit: 2020-02-28 | Discharge: 2020-02-28 | Disposition: A | Discharge status: Home / Self Care | Payer: BC Managed Care – PPO | Attending: Emergency Medicine | Admitting: Emergency Medicine

## 2020-02-28 DIAGNOSIS — F419 Anxiety disorder, unspecified: Secondary | ICD-10-CM | POA: Diagnosis not present

## 2020-02-28 DIAGNOSIS — R200 Anesthesia of skin: Secondary | ICD-10-CM | POA: Insufficient documentation

## 2020-02-28 DIAGNOSIS — R0602 Shortness of breath: Secondary | ICD-10-CM | POA: Insufficient documentation

## 2020-02-28 DIAGNOSIS — Z5321 Procedure and treatment not carried out due to patient leaving prior to being seen by health care provider: Secondary | ICD-10-CM | POA: Diagnosis not present

## 2020-02-28 LAB — BASIC METABOLIC PANEL
BUN: 13 mg/dL (ref 7–25)
CO2: 32 mmol/L (ref 20–32)
Calcium: 9.6 mg/dL (ref 8.6–10.3)
Chloride: 101 mmol/L (ref 98–110)
Creat: 1.05 mg/dL (ref 0.60–1.35)
Glucose, Bld: 113 mg/dL — ABNORMAL HIGH (ref 65–99)
Potassium: 4.3 mmol/L (ref 3.5–5.3)
Sodium: 138 mmol/L (ref 135–146)

## 2020-02-28 LAB — HIV ANTIBODY (ROUTINE TESTING W REFLEX): HIV 1&2 Ab, 4th Generation: NONREACTIVE

## 2020-02-28 NOTE — ED Triage Notes (Signed)
Arrived POV from home. Patient reports worsening symptoms of anxiety all week. Patient states that he has felt short of breath, intermittent numbness around mouth, dry mouth, and intermittent numbness in left arm. Patient also states he has felt somewhat disoriented lately. NAD

## 2020-03-01 ENCOUNTER — Emergency Department (HOSPITAL_BASED_OUTPATIENT_CLINIC_OR_DEPARTMENT_OTHER)
Admission: EM | Admit: 2020-03-01 | Discharge: 2020-03-01 | Disposition: A | Discharge status: Home / Self Care | Payer: BC Managed Care – PPO | Attending: Emergency Medicine | Admitting: Emergency Medicine

## 2020-03-01 ENCOUNTER — Encounter (HOSPITAL_BASED_OUTPATIENT_CLINIC_OR_DEPARTMENT_OTHER): Payer: Self-pay | Admitting: Emergency Medicine

## 2020-03-01 ENCOUNTER — Other Ambulatory Visit: Payer: Self-pay

## 2020-03-01 ENCOUNTER — Emergency Department (HOSPITAL_BASED_OUTPATIENT_CLINIC_OR_DEPARTMENT_OTHER): Payer: BC Managed Care – PPO

## 2020-03-01 DIAGNOSIS — Z79899 Other long term (current) drug therapy: Secondary | ICD-10-CM | POA: Diagnosis not present

## 2020-03-01 DIAGNOSIS — R0602 Shortness of breath: Secondary | ICD-10-CM | POA: Insufficient documentation

## 2020-03-01 DIAGNOSIS — R079 Chest pain, unspecified: Secondary | ICD-10-CM

## 2020-03-01 DIAGNOSIS — R0789 Other chest pain: Secondary | ICD-10-CM | POA: Diagnosis not present

## 2020-03-01 DIAGNOSIS — R202 Paresthesia of skin: Secondary | ICD-10-CM | POA: Diagnosis not present

## 2020-03-01 DIAGNOSIS — R200 Anesthesia of skin: Secondary | ICD-10-CM | POA: Diagnosis not present

## 2020-03-01 DIAGNOSIS — R42 Dizziness and giddiness: Secondary | ICD-10-CM | POA: Diagnosis not present

## 2020-03-01 LAB — CBC WITH DIFFERENTIAL/PLATELET
Abs Immature Granulocytes: 0 10*3/uL (ref 0.00–0.07)
Basophils Absolute: 0 10*3/uL (ref 0.0–0.1)
Basophils Relative: 1 %
Eosinophils Absolute: 0.1 10*3/uL (ref 0.0–0.5)
Eosinophils Relative: 1 %
HCT: 44.6 % (ref 39.0–52.0)
Hemoglobin: 15 g/dL (ref 13.0–17.0)
Immature Granulocytes: 0 %
Lymphocytes Relative: 53 %
Lymphs Abs: 1.8 10*3/uL (ref 0.7–4.0)
MCH: 30.1 pg (ref 26.0–34.0)
MCHC: 33.6 g/dL (ref 30.0–36.0)
MCV: 89.4 fL (ref 80.0–100.0)
Monocytes Absolute: 0.3 10*3/uL (ref 0.1–1.0)
Monocytes Relative: 8 %
Neutro Abs: 1.3 10*3/uL — ABNORMAL LOW (ref 1.7–7.7)
Neutrophils Relative %: 37 %
Platelets: 211 10*3/uL (ref 150–400)
RBC: 4.99 MIL/uL (ref 4.22–5.81)
RDW: 12.2 % (ref 11.5–15.5)
WBC: 3.5 10*3/uL — ABNORMAL LOW (ref 4.0–10.5)
nRBC: 0 % (ref 0.0–0.2)

## 2020-03-01 LAB — COMPREHENSIVE METABOLIC PANEL
ALT: 18 U/L (ref 0–44)
AST: 21 U/L (ref 15–41)
Albumin: 4.8 g/dL (ref 3.5–5.0)
Alkaline Phosphatase: 68 U/L (ref 38–126)
Anion gap: 10 (ref 5–15)
BUN: 12 mg/dL (ref 6–20)
CO2: 29 mmol/L (ref 22–32)
Calcium: 9.7 mg/dL (ref 8.9–10.3)
Chloride: 101 mmol/L (ref 98–111)
Creatinine, Ser: 0.86 mg/dL (ref 0.61–1.24)
GFR calc Af Amer: >60 mL/min (ref >60)
GFR calc non Af Amer: >60 mL/min (ref >60)
Glucose, Bld: 100 mg/dL — ABNORMAL HIGH (ref 70–99)
Potassium: 4 mmol/L (ref 3.5–5.1)
Sodium: 140 mmol/L (ref 135–145)
Total Bilirubin: 0.8 mg/dL (ref 0.3–1.2)
Total Protein: 8.4 g/dL — ABNORMAL HIGH (ref 6.5–8.1)

## 2020-03-01 LAB — URINALYSIS, ROUTINE W REFLEX MICROSCOPIC
Bilirubin Urine: NEGATIVE
Glucose, UA: NEGATIVE mg/dL
Hgb urine dipstick: NEGATIVE
Ketones, ur: NEGATIVE mg/dL
Leukocytes,Ua: NEGATIVE
Nitrite: NEGATIVE
Protein, ur: NEGATIVE mg/dL
Specific Gravity, Urine: <1.005 — ABNORMAL LOW (ref 1.005–1.030)
pH: 7 (ref 5.0–8.0)

## 2020-03-01 LAB — TROPONIN I (HIGH SENSITIVITY): Troponin I (High Sensitivity): 3 ng/L (ref <18)

## 2020-03-01 MED ORDER — SODIUM CHLORIDE 0.9 % IV BOLUS
1000.0000 mL | Freq: Once | INTRAVENOUS | Status: AC
  Administered 2020-03-01: 1000 mL via INTRAVENOUS

## 2020-03-01 MED ORDER — LIDOCAINE-EPINEPHRINE-TETRACAINE (LET) TOPICAL GEL: 3.0000 mL | Freq: Once | TOPICAL | Status: DC

## 2020-03-01 NOTE — ED Provider Notes (Signed)
[facility] EMERGENCY DEPARTMENT Provider Note   CSN: [id number] Arrival date & time: 03/01/20  1545     History Chief Complaint  Patient presents with  . Arm numbness    Paul Beasley is a 29 y.o. male past no history of HIV exposure who presents for evaluation intermittent episodes of lightheadedness, chest pain, tingling sensation in his arm that has been ongoing for the last 4 days.  Patient reports that he first noticed the symptoms about 4 days ago.  He states that he felt lightheaded like he was going to pass out and felt sweaty.  He reports that at that time, he felt like he is not getting enough air in.  He reports that since then, he has had intermittent tingling sensation to his left upper extremity.  He reports that it is from the distal upper arm into the shoulder into the anterior chest wall.  He states that it comes and goes.  It is not consistent.  No preceding trauma, injury, fall.  He also describes a tightness sometimes around his arm.  He has not noticed any overlying warmth, erythema.  He also reports he has had some intermittent chest pain for the last 4 days.  He states that chest pain hurts more when he moves his arm.  Is not brought on by exertion as he has had the chest pain both at rest and while doing things.  He states it feels like a tightness.  He states it last for a few seconds and goes away by itself.  He states he has not had any preceding trauma, injury.  He also reports that when he has these episodes where he feels like he is going to pass out he feels like he cannot get enough air in.  He states that he does not have difficulty breathing all the time.  He does not smoke and denies any drug use.  He does state that he has been stressed recently and has been working a lot.  Patient states he has not had any vision changes, abdominal pain, nausea/vomiting, weakness of his legs.  Patient denies any personal cardiac history.  No family history of MI before  the age of 85. He denies any exogenous hormone use, recent immobilization, prior history of DVT/PE, recent surgery, leg swelling, or long travel.  The history is provided by the patient.       Past Medical History:  Diagnosis Date  . Migraine   . Testicular torsion     Patient Active Problem List   Diagnosis Date Noted  . 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 Frequent urination 12/29/2017  . Urethritis 12/29/2017  . 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 Elevated lactic acid level 07/02/2016  . AKI (acute kidney injury) (HCC) 07/02/2016  . Sepsis (HCC) 07/02/2016  . Tinea versicolor 06/21/2016  . URI (upper respiratory infection) 11/21/2015  . Routine general medical examination at a health care facility 01/22/2015    Past Surgical History:  Procedure Laterality Date  . ORCHIECTOMY Bilateral 06/24/2016   Procedure: SCROTAL EXPLORATION, RIGHT ORCHIOPEXY, LEFT ORCHIECTOMY;  Surgeon: Marcine Matar, MD;  Location: [facility];  Service: Urology;  Laterality: Bilateral;  . WISDOM TOOTH EXTRACTION         Family History  Problem Relation Age of Onset  . Hypertension Mother   . Hypertension Paternal Grandmother   . Hypertension Paternal Grandfather   . Healthy Sister     Social History   Tobacco Use  . Smoking status: Never  Smoker  . Smokeless tobacco: Never Used  Substance Use Topics  . Alcohol use: No    Alcohol/week: 0.0 standard drinks  . Drug use: No    Home Medications Prior to Admission medications   Medication Sig Start Date End Date Taking? Authorizing Provider  emtricitabine-tenofovir AF (DESCOVY) 200-25 MG tablet Take 1 tablet by mouth daily. 12/04/19   Kuppelweiser, Cassie L, RPH-CPP  Turmeric (QC TUMERIC COMPLEX PO) Take by mouth.    [provider]    Allergies    Patient has no known allergies.  Review of Systems   Review of Systems  Constitutional: Negative for fever.  Respiratory: Positive for shortness of breath. Negative for cough.     Cardiovascular: Positive for chest pain. Negative for leg swelling.  Gastrointestinal: Negative for abdominal pain, nausea and vomiting.  Genitourinary: Negative for dysuria and hematuria.  Neurological: Positive for light-headedness and numbness. Negative for weakness and headaches.  All other systems reviewed and are negative.   Physical Exam Updated Vital Signs BP (!) 133/93   Pulse 75   Temp 98.7 F (37.1 C) (Oral)   Resp 13   SpO2 100%   Physical Exam Vitals and nursing note reviewed.  Constitutional:      Appearance: Normal appearance. He is well-developed.  HENT:     Head: Normocephalic and atraumatic.  Eyes:     General: Lids are normal.     Conjunctiva/sclera: Conjunctivae normal.     Pupils: Pupils are equal, round, and reactive to light.  Cardiovascular:     Rate and Rhythm: Normal rate and regular rhythm.     Pulses: Normal pulses.          Radial pulses are 2+ on the right side and 2+ on the left side.     Heart sounds: Normal heart sounds. No murmur. No friction rub. No gallop.   Pulmonary:     Effort: Pulmonary effort is normal.     Breath sounds: Normal breath sounds.     Comments: Lungs clear to auscultation bilaterally.  Symmetric chest rise.  No wheezing, rales, rhonchi. Chest:     Comments: No anterior chest wall tenderness.  No deformity or crepitus noted.  No evidence of flail chest. Abdominal:     Palpations: Abdomen is soft. Abdomen is not rigid.     Tenderness: There is no abdominal tenderness. There is no guarding.     Comments: Abdomen is soft, non-distended, non-tender. No rigidity, No guarding. No peritoneal signs.  Musculoskeletal:        General: Normal range of motion.     Cervical back: Full passive range of motion without pain.     Comments: Left upper extremity without any overlying warmth, erythema, edema.  Full range of motion without any difficulty.  Skin:    General: Skin is warm and dry.     Capillary Refill: Capillary refill  takes less than 2 seconds.  Neurological:     Mental Status: He is alert and oriented to person, place, and time.     Comments: Cranial nerves III-XII intact Follows commands, Moves all extremities  5/5 strength to BUE and BLE  He reports that he can feel me touching him on his left upper extremity but does feel like he has a tingling or tightness sensation.  This extends from the distal upper arm into the shoulder.  It does not extend down the distal aspect of his left upper extremity.  Sensation intact throughout all major nerve distributions No  gait abnormalities  No slurred speech. No facial droop.   Psychiatric:        Speech: Speech normal.     ED Results / Procedures / Treatments   Labs (all labs ordered are listed, but only abnormal results are displayed) Labs Reviewed  COMPREHENSIVE METABOLIC PANEL - Abnormal; Notable for the following components:      Result Value   Glucose, Bld 100 (*)    Total Protein 8.4 (*)    All other components within normal limits  CBC WITH DIFFERENTIAL/PLATELET - Abnormal; Notable for the following components:   WBC 3.5 (*)    Neutro Abs 1.3 (*)    All other components within normal limits  URINALYSIS, ROUTINE W REFLEX MICROSCOPIC - Abnormal; Notable for the following components:   Specific Gravity, Urine <1.005 (*)    All other components within normal limits  TROPONIN I (HIGH SENSITIVITY)    EKG EKG Interpretation  Date/Time:  "Sunday Mar 01 2020 18:13:39 EDT Ventricular Rate:  70 PR Interval:    QRS Duration: 96 QT Interval:  370 QTC Calculation: 400 R Axis:   79 Text Interpretation: Sinus rhythm RSR' in V1 or V2, right VCD or RVH ST elevation suggests acute pericarditis Confirmed by Dykstra, Richard ([id number]) on 03/01/2020 7:04:13 PM   Radiology DG Chest 2 View  Result Date: 03/01/2020 CLINICAL DATA:  Chest pain and arm numbness, near syncope EXAM: CHEST - 2 VIEW COMPARISON:  Radiograph 07/01/2016 FINDINGS: No consolidation,  features of edema, pneumothorax, or effusion. Pulmonary vascularity is normally distributed. The cardiomediastinal contours are unremarkable. No acute osseous or soft tissue abnormality. IMPRESSION: No acute cardiopulmonary abnormality. Electronically Signed   By: Price  DeHay M.D.   On: 03/01/2020 19:23   CT Head Wo Contrast  Result Date: 03/01/2020 CLINICAL DATA:  Left arm numbness for 2-3 days, no injury EXAM: CT HEAD WITHOUT CONTRAST TECHNIQUE: Contiguous axial images were obtained from the base of the skull through the vertex without intravenous contrast. COMPARISON:  None. FINDINGS: Brain: No evidence of acute infarction, hemorrhage, hydrocephalus, extra-axial collection or mass lesion/mass effect. Vascular: No hyperdense vessel or unexpected calcification. Skull: No calvarial fracture or suspicious osseous lesion. No scalp swelling or hematoma. Sinuses/Orbits: Fluid level seen in the right maxillary sinus. Remaining paranasal sinuses are predominantly clear. Mastoid air cells are well aerated. Debris in the right external auditory canal. Included orbital structures are unremarkable. Other: None IMPRESSION: 1. No acute intracranial abnormality. 2. Fluid level in the right maxillary sinus. Correlate for clinical features of acute sinusitis. 3. Debris in the right external auditory canal, correlate with visual inspection for cerumen impaction. Electronically Signed   By: Price  DeHay M.D.   On: 03/01/2020 19:25    Procedures Procedures (including critical care time)  Medications Ordered in ED Medications  sodium chloride 0.9 % bolus 1,000 mL (0 mLs Intravenous Stopped 03/01/20 2035)    ED Course  I have reviewed the triage vital signs and the nursing notes.  Pertinent labs & imaging results that were available during my care of the patient were reviewed by me and considered in my medical decision making (see chart for details).    MDM Rules/Calculators/A&P                      28"  year old  male who presents for evaluation of intermittent episodes of lightheadedness, shortness of breath, chest pain, tingling sensation left upper extremity that has been ongoing for last 4 days.  He states that these  episodes occur randomly.  Sometimes the symptoms do not occur altogether.  He reports that sometimes he does have some tingling sensation and other times he just feels lightheaded and feels like he is not getting enough air in.  He does report that he has been stressed recently.  No cardiac risk factors.  No PE risk factors.  On initially arrival, he is afebrile nontoxic-appearing.  Vital signs are stable.  Patient with good distal pulses.  On exam, he has full range of motion without any signs of weakness of his upper extremity.  He reports he can feel me touching him but has a tingling station in his upper arm.  Exam not concerning for CVA, septic arthritis, ischemic limb, DVT of upper extremity.  Low suspicion for ACS etiology.  He has atypical features and has had this pain both at rest and when active.  He states that the pain is not worse with exertion and is not brought on by exertional activities.  Low suspicion for intracranial mass but also consideration given age and atypical features.  History/physical exam not concerning for PE, dissection.  Plan to check labs, imaging.  CT head negative for any acute abnormality.  Chest x-ray negative for any acute abnormality.  CMP shows normal BUN and creatinine.  UA negative for any infectious etiology.  Troponin negative.  CBC shows slight leukopenia but otherwise unremarkable.  Discussed results with patient.  At this time, he is young with no cardiac risk factors.  Story sounds atypical.  This has been ongoing for last 4 days.  1 troponin sufficient for rule out.  At this time, do not suspect this is ACS etiology.  I would question of there is a component of stress to this.  He does report that he has been stressed and has been working a lot  recently.  While sitting here in the ED, he feels that his symptoms have gotten better and he has not had any further episodes of near syncope.  We will plan to give outpatient neuro referral for tingling sensation in his arm. At this time, patient exhibits no emergent life-threatening condition that require further evaluation in ED or admission. Patient had ample opportunity for questions and discussion. All patient's questions were answered with full understanding. Strict return precautions discussed. Patient expresses understanding and agreement to plan.   Portions of this note were generated with Scientist, clinical (histocompatibility and immunogenetics). Dictation errors may occur despite best attempts at proofreading   Final Clinical Impression(s) / ED Diagnoses Final diagnoses:  Nonspecific chest pain  Paresthesia  Lightheadedness    Rx / DC Orders ED Discharge Orders    None       Maxwell Caul, PA-C 03/01/20 2346    Milagros Loll, MD 03/02/20 1520

## 2020-03-01 NOTE — ED Notes (Signed)
Patient on monitor, denies dizziness at this time.

## 2020-03-01 NOTE — Discharge Instructions (Signed)
As we discussed, your work-up today was reassuring.  I did not see any obvious reason for your symptoms.  As we discussed, we will plan to have you follow-up with outpatient neuro if you continue tingling sensation in your arm.  Additionally, as we discussed, there may be a component of stress.  I provided some outpatient counseling process.  Return to the emergency department for any worsening chest pain, difficulty breathing, difficulty moving your arm or leg, difficulty walking or any other worsening or concerning symptoms.

## 2020-03-01 NOTE — ED Triage Notes (Signed)
PT here with arm numbness since Friday night and was near syncopal when that happened. Intermittent low unilateral back pain and "feels like he cannot get enough air" sometimes.

## 2020-03-02 ENCOUNTER — Other Ambulatory Visit: Payer: Self-pay | Admitting: Pharmacist

## 2020-03-02 ENCOUNTER — Encounter: Payer: Self-pay | Admitting: Pharmacist

## 2020-03-02 DIAGNOSIS — Z7253 High risk bisexual behavior: Secondary | ICD-10-CM

## 2020-03-02 MED ORDER — DESCOVY 200-25 MG PO TABS: 1.0000 | ORAL_TABLET | Freq: Every day | ORAL | 2 refills | Status: DC

## 2020-03-02 NOTE — Progress Notes (Signed)
Patient's HIV antibody is negative.  Will send in 3 more months of Descovy to [facility].  

## 2020-03-05 DIAGNOSIS — M5412 Radiculopathy, cervical region: Secondary | ICD-10-CM | POA: Diagnosis not present

## 2020-03-05 DIAGNOSIS — M79602 Pain in left arm: Secondary | ICD-10-CM | POA: Diagnosis not present

## 2020-03-09 ENCOUNTER — Ambulatory Visit: Payer: BC Managed Care – PPO | Admitting: Neurology

## 2020-03-09 ENCOUNTER — Encounter: Payer: Self-pay | Admitting: Neurology

## 2020-03-22 ENCOUNTER — Encounter: Payer: Self-pay | Admitting: Pharmacist

## 2020-03-22 DIAGNOSIS — Z7253 High risk bisexual behavior: Secondary | ICD-10-CM | POA: Diagnosis not present

## 2020-03-23 ENCOUNTER — Other Ambulatory Visit: Payer: Self-pay | Admitting: Pharmacist

## 2020-03-23 ENCOUNTER — Telehealth: Payer: Self-pay | Admitting: Pharmacist

## 2020-03-23 DIAGNOSIS — Z7253 High risk bisexual behavior: Secondary | ICD-10-CM

## 2020-03-23 NOTE — Telephone Encounter (Signed)
Agree  - thank you for reassuring him Cassie

## 2020-03-23 NOTE — Telephone Encounter (Signed)
Patient currently takes Descovy for PrEP. He called this AM after a possible exposure on Friday with a partner who is HIV + and undetectable. He wanted to know if he should take PEP.  I explained that if his partner was undetectable, there is essentially a 0% chance that he could pass the virus to him. Also, the fact that he is on PrEP is another layer of protection and the reason he is on PrEP. Discussed with Judeth Cornfield as well. He will continue PrEP and come in for testing this week just to ease his mind and anxiety.

## 2020-03-24 ENCOUNTER — Other Ambulatory Visit: Payer: BC Managed Care – PPO

## 2020-03-24 ENCOUNTER — Other Ambulatory Visit: Payer: Self-pay

## 2020-03-24 DIAGNOSIS — Z7253 High risk bisexual behavior: Secondary | ICD-10-CM

## 2020-03-25 LAB — HIV-1 RNA QUANT-NO REFLEX-BLD
HIV 1 RNA Quant: <20 copies/mL
HIV-1 RNA Quant, Log: <1.3 Log copies/mL

## 2020-03-26 ENCOUNTER — Encounter: Payer: Self-pay | Admitting: Pharmacist

## 2020-03-27 DIAGNOSIS — F419 Anxiety disorder, unspecified: Secondary | ICD-10-CM | POA: Diagnosis not present

## 2020-03-27 DIAGNOSIS — R519 Headache, unspecified: Secondary | ICD-10-CM | POA: Diagnosis not present

## 2020-03-27 DIAGNOSIS — R682 Dry mouth, unspecified: Secondary | ICD-10-CM | POA: Diagnosis not present

## 2020-03-27 DIAGNOSIS — R42 Dizziness and giddiness: Secondary | ICD-10-CM | POA: Diagnosis not present

## 2020-03-30 ENCOUNTER — Telehealth: Payer: Self-pay | Admitting: Pharmacy Technician

## 2020-03-30 MED FILL — DESCOVY 200-25 MG TABS: 200-25 | 30 days supply | Qty: 30 | Fill #2

## 2020-03-30 NOTE — Telephone Encounter (Signed)
[facility] Patient Advocate Encounter   I was successful in securing patient a $7500 grant from [employer] ([employer]) to provide copayment coverage for Descovy.  This will make the out of pocket cost $0.     I have spoken with the patient.    The billing information is as follows and has been shared with [facility].    Patient knows to call the office with questions or concerns.  Netty Starring. Dimas Aguas CPhT [facility] Patient [facility] Phone: [phone] Fax:  [phone]

## 2020-04-03 DIAGNOSIS — F419 Anxiety disorder, unspecified: Secondary | ICD-10-CM | POA: Diagnosis not present

## 2020-04-30 MED FILL — DESCOVY 200-25 MG TABS: 200-25 | 30 days supply | Qty: 30 | Fill #0

## 2020-06-01 ENCOUNTER — Other Ambulatory Visit: Payer: Self-pay

## 2020-06-01 ENCOUNTER — Ambulatory Visit (INDEPENDENT_AMBULATORY_CARE_PROVIDER_SITE_OTHER): Payer: BC Managed Care – PPO | Admitting: Pharmacist

## 2020-06-01 ENCOUNTER — Other Ambulatory Visit (HOSPITAL_COMMUNITY)
Admission: RE | Admit: 2020-06-01 | Discharge: 2020-06-01 | Disposition: A | Discharge status: Home / Self Care | Payer: BC Managed Care – PPO | Source: Ambulatory Visit | Attending: Infectious Disease | Admitting: Infectious Disease

## 2020-06-01 DIAGNOSIS — Z113 Encounter for screening for infections with a predominantly sexual mode of transmission: Secondary | ICD-10-CM | POA: Diagnosis not present

## 2020-06-01 DIAGNOSIS — Z79899 Other long term (current) drug therapy: Secondary | ICD-10-CM | POA: Insufficient documentation

## 2020-06-01 NOTE — Progress Notes (Signed)
Date:  06/01/2020   HPI: Paul Beasley is a 29 y.o. male who presents to the [facility] pharmacy clinic for 3 month PrEP follow-up.  Insured   [x]    Uninsured  []    Patient Active Problem List   Diagnosis Date Noted  . 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 Frequent urination 12/29/2017  . Urethritis 12/29/2017  . 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 Elevated lactic acid level 07/02/2016  . AKI (acute kidney injury) (HCC) 07/02/2016  . Sepsis (HCC) 07/02/2016  . Tinea versicolor 06/21/2016  . URI (upper respiratory infection) 11/21/2015  . Routine general medical examination at a health care facility 01/22/2015    Patient's Medications  New Prescriptions   No medications on file  Previous Medications   EMTRICITABINE-TENOFOVIR AF (DESCOVY) 200-25 MG TABLET    Take 1 tablet by mouth daily.   TURMERIC (QC TUMERIC COMPLEX PO)    Take by mouth.  Modified Medications   No medications on file  Discontinued Medications   No medications on file    Allergies: No Known Allergies  Past Medical History: Past Medical History:  Diagnosis Date  . Migraine   . Testicular torsion     Social History: Social History   Socioeconomic History  . Marital status: Single    Spouse name: Not on file  . Number of children: Not on file  . Years of education: Not on file  . Highest education level: Not on file  Occupational History  . Occupation: 01/19/2016  Tobacco Use  . Smoking status: Never Smoker  . Smokeless tobacco: Never Used  Substance and Sexual Activity  . Alcohol use: No    Alcohol/week: 0.0 standard drinks  . Drug use: No  . Sexual activity: Yes    Partners: Female, Male    Comment: offered condoms  Other Topics Concern  . Not on file  Social History Narrative  . Not on file   Social Determinants of Health   Financial Resource Strain:   . Difficulty of Paying Living Expenses:   Food Insecurity:   . Worried About 03/24/2015 in the Last Year:   . Chief Technology Officer in the  Last Year:   Transportation Needs:   . Programme researcher, broadcasting/film/video (Medical):   Barista Lack of Transportation (Non-Medical):   Physical Activity:   . Days of Exercise per Week:   . Minutes of Exercise per Session:   Stress:   . Feeling of Stress :   Social Connections:   . Frequency of Communication with Friends and Family:   . Frequency of Social Gatherings with Friends and Family:   . Attends Religious Services:   . Active Member of Clubs or Organizations:   . Attends Freight forwarder Meetings:   Marland Kitchen Marital Status:     CHL HIV PREP FLOWSHEET RESULTS 12/03/2019 05/31/2019 02/28/2019 11/30/2018 07/31/2018 05/03/2018 04/03/2018  Insurance Status Insured Insured Insured Insured Insured Insured Insured  How did you hear? - - - - - Primary referral Referral from primary  Gender at birth Male Male Male Male Male Male Male  Gender identity cis-Male cis-Male cis-Male cis-Male cis-Male cis-Male cis-Male  Risk for HIV - Condomless vaginal or anal intercourse;Hx of STI >5 partners in past 6 mos (regardless of condom use);Hx of STI;Condomless vaginal or anal intercourse >5 partners in past 6 mos (regardless of condom use);Condomless vaginal or anal intercourse In sexual relationship with HIV+ partner;Condomless vaginal or anal intercourse;>5 partners in past 6 mos (regardless of condom  use) In sexual relationship with HIV+ partner;Condomless vaginal or anal intercourse;>5 partners in past 6 mos (regardless of condom use) Condomless vaginal or anal intercourse  Sex Partners Both men and women Both men and women Both men and women Both men and women Both men and women Both men and women Both men and women  # sex partners past 3-6 mos 1-3 1-3 1-3 1-3 1-3 1-3 4-6  Sex activity preferences Insertive;Oral Insertive and receptive;Oral Insertive;Oral Insertive;Oral Insertive;Oral Insertive;Oral Insertive;Oral  Condom use Yes Yes Yes Yes Yes Yes Yes  % condom use 75 100 90 100 100 90 90  Partners genders and ages - - -  - 31 20-24;M 25-29;F 30-49;F 25-29;F 20-24;M 30-49 M 20-24;M 25-29;F 20-24;F 25-29  Treated for STI? - No No No No No No  HIV symptoms? - N/A N/A N/A N/A N/A Swollen lymph nodes in your neck  PrEP Eligibility - Substantial risk for HIV Substantial risk for HIV Substantial risk for HIV HIV negative;CrCl >60 ml/min;Substantial risk for HIV HIV negative;CrCl >60 ml/min;Substantial risk for HIV HIV negative;CrCl >60 ml/min;Substantial risk for HIV  Paper work received? - - - - - No No    Labs:  SCr: Lab Results  Component Value Date   CREATININE 0.86 03/01/2020   CREATININE 1.05 02/27/2020   CREATININE 0.99 09/03/2019   CREATININE 0.99 05/30/2019   CREATININE 1.12 11/30/2018   HIV Lab Results  Component Value Date   HIV NON-REACTIVE 02/27/2020   HIV NON-REACTIVE 12/03/2019   HIV NON-REACTIVE 09/03/2019   HIV NON-REACTIVE 05/30/2019   HIV NON-REACTIVE 02/27/2019   Hepatitis B Lab Results  Component Value Date   HEPBSAB REACTIVE (A) 02/27/2019   HEPBSAG NON-REACTIVE 04/03/2018   Hepatitis C No results found for: HEPCAB, HCVRNAPCRQN Hepatitis A Lab Results  Component Value Date   HAV REACTIVE (A) 04/03/2018   RPR and STI Lab Results  Component Value Date   LABRPR NON-REACTIVE 12/03/2019   LABRPR NON-REACTIVE 05/30/2019   LABRPR NON-REACTIVE 11/30/2018   LABRPR NON-REACTIVE 04/03/2018   LABRPR Non Reactive 01/16/2018    STI Results GC CT  12/03/2019 Negative Negative  12/03/2019 Negative Negative  06/20/2019 Negative Negative  05/30/2019 Negative Negative  05/30/2019 Negative Negative  05/30/2019 Negative Negative  11/30/2018 Negative Negative  11/30/2018 **POSITIVE**(A) Negative  04/03/2018 Negative Negative  04/03/2018 Negative Negative  04/03/2018 Negative Negative  01/16/2018 - Negative  12/29/2017 - Negative    Assessment: Paul Beasley is here today for his 3 month PrEP follow up appointment. He continues to do well on Descovy and misses no doses.  He has had a sore  throat on and off lately. He does suffer from allergies and I encouraged him to take an allergy pill to help with the post-nasal drainage issue he is having. No s/sx of STDs or acute HIV. He has had one new partner and they used condoms. Will update labs and see him back in 3 months.  Plan: - HIV antibody, RPR, urine/oral cytology - Descovy x 3 months if HIV negative - F/u in 3 months  Cassie L. Kuppelweiser, PharmD, BCIDP, AAHIVP, CPP Clinical Pharmacist Practitioner Infectious Diseases Clinical Pharmacist [facility] 06/01/2020, 11:47 AM

## 2020-06-02 ENCOUNTER — Other Ambulatory Visit: Payer: Self-pay | Admitting: Pharmacist

## 2020-06-02 DIAGNOSIS — Z7253 High risk bisexual behavior: Secondary | ICD-10-CM

## 2020-06-02 LAB — URINE CYTOLOGY ANCILLARY ONLY
Chlamydia: NEGATIVE
Comment: NEGATIVE
Comment: NORMAL
Neisseria Gonorrhea: NEGATIVE

## 2020-06-02 LAB — CYTOLOGY, (ORAL, ANAL, URETHRAL) ANCILLARY ONLY
Chlamydia: NEGATIVE
Comment: NEGATIVE
Comment: NORMAL
Neisseria Gonorrhea: NEGATIVE

## 2020-06-02 LAB — RPR: RPR Ser Ql: NONREACTIVE

## 2020-06-02 LAB — HIV ANTIBODY (ROUTINE TESTING W REFLEX): HIV 1&2 Ab, 4th Generation: NONREACTIVE

## 2020-06-02 MED ORDER — DESCOVY 200-25 MG PO TABS: 1.0000 | ORAL_TABLET | Freq: Every day | ORAL | 2 refills | Status: DC

## 2020-06-02 NOTE — Progress Notes (Signed)
Patient's HIV antibody is negative.  Will send in 3 more months of Descovy to [facility].  

## 2020-06-05 MED FILL — DESCOVY 200-25 MG TABS: 200-25 | 30 days supply | Qty: 30 | Fill #1

## 2020-06-17 ENCOUNTER — Other Ambulatory Visit: Payer: Self-pay

## 2020-06-17 ENCOUNTER — Other Ambulatory Visit: Payer: BC Managed Care – PPO

## 2020-06-17 DIAGNOSIS — Z20822 Contact with and (suspected) exposure to covid-19: Secondary | ICD-10-CM

## 2020-06-18 LAB — NOVEL CORONAVIRUS, NAA: SARS-CoV-2, NAA: NOT DETECTED

## 2020-06-20 DIAGNOSIS — R9431 Abnormal electrocardiogram [ECG] [EKG]: Secondary | ICD-10-CM | POA: Diagnosis not present

## 2020-06-20 DIAGNOSIS — R110 Nausea: Secondary | ICD-10-CM | POA: Diagnosis not present

## 2020-06-20 DIAGNOSIS — R4182 Altered mental status, unspecified: Secondary | ICD-10-CM | POA: Diagnosis not present

## 2020-06-20 DIAGNOSIS — I10 Essential (primary) hypertension: Secondary | ICD-10-CM | POA: Diagnosis not present

## 2020-06-20 DIAGNOSIS — T50904A Poisoning by unspecified drugs, medicaments and biological substances, undetermined, initial encounter: Secondary | ICD-10-CM | POA: Diagnosis not present

## 2020-06-24 ENCOUNTER — Other Ambulatory Visit: Payer: BC Managed Care – PPO

## 2020-06-29 MED FILL — DESCOVY 200-25 MG TABS: 200-25 | 30 days supply | Qty: 30 | Fill #2

## 2020-07-28 MED FILL — DESCOVY 200-25 MG TABS: 200-25 | 30 days supply | Qty: 30 | Fill #0

## 2020-08-07 DIAGNOSIS — Z03818 Encounter for observation for suspected exposure to other biological agents ruled out: Secondary | ICD-10-CM | POA: Diagnosis not present

## 2020-08-11 DIAGNOSIS — N342 Other urethritis: Secondary | ICD-10-CM | POA: Diagnosis not present

## 2020-08-24 ENCOUNTER — Other Ambulatory Visit: Payer: Self-pay

## 2020-08-24 ENCOUNTER — Other Ambulatory Visit (HOSPITAL_COMMUNITY)
Admission: RE | Admit: 2020-08-24 | Discharge: 2020-08-24 | Disposition: A | Discharge status: Home / Self Care | Payer: BC Managed Care – PPO | Source: Ambulatory Visit | Attending: Infectious Disease | Admitting: Infectious Disease

## 2020-08-24 ENCOUNTER — Ambulatory Visit (INDEPENDENT_AMBULATORY_CARE_PROVIDER_SITE_OTHER): Payer: BC Managed Care – PPO | Admitting: Pharmacist

## 2020-08-24 DIAGNOSIS — Z113 Encounter for screening for infections with a predominantly sexual mode of transmission: Secondary | ICD-10-CM | POA: Diagnosis not present

## 2020-08-24 DIAGNOSIS — Z7253 High risk bisexual behavior: Secondary | ICD-10-CM | POA: Diagnosis not present

## 2020-08-24 DIAGNOSIS — Z79899 Other long term (current) drug therapy: Secondary | ICD-10-CM

## 2020-08-24 NOTE — Progress Notes (Signed)
Date:  08/24/2020   HPI: Paul Beasley is a 29 y.o. male who presents to the [facility] pharmacy clinic for HIV PrEP follow-up.  Insured   [x]    Uninsured  []    Patient Active Problem List   Diagnosis Date Noted  . 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 Frequent urination 12/29/2017  . Urethritis 12/29/2017  . 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 Elevated lactic acid level 07/02/2016  . AKI (acute kidney injury) (HCC) 07/02/2016  . Sepsis (HCC) 07/02/2016  . Tinea versicolor 06/21/2016  . URI (upper respiratory infection) 11/21/2015  . Routine general medical examination at a health care facility 01/22/2015    Patient's Medications  New Prescriptions   No medications on file  Previous Medications   EMTRICITABINE-TENOFOVIR AF (DESCOVY) 200-25 MG TABLET    Take 1 tablet by mouth daily.   TURMERIC (QC TUMERIC COMPLEX PO)    Take by mouth.  Modified Medications   No medications on file  Discontinued Medications   No medications on file    Allergies: No Known Allergies  Past Medical History: Past Medical History:  Diagnosis Date  . Migraine   . Testicular torsion     Social History: Social History   Socioeconomic History  . Marital status: Single    Spouse name: Not on file  . Number of children: Not on file  . Years of education: Not on file  . Highest education level: Not on file  Occupational History  . Occupation: 01/19/2016  Tobacco Use  . Smoking status: Never Smoker  . Smokeless tobacco: Never Used  Substance and Sexual Activity  . Alcohol use: No    Alcohol/week: 0.0 standard drinks  . Drug use: No  . Sexual activity: Yes    Partners: Female, Male    Comment: offered condoms  Other Topics Concern  . Not on file  Social History Narrative  . Not on file   Social Determinants of Health   Financial Resource Strain:   . Difficulty of Paying Living Expenses: Not on file  Food Insecurity:   . Worried About 03/24/2015 in the Last Year: Not on file  . Ran  Out of Food in the Last Year: Not on file  Transportation Needs:   . Lack of Transportation (Medical): Not on file  . Lack of Transportation (Non-Medical): Not on file  Physical Activity:   . Days of Exercise per Week: Not on file  . Minutes of Exercise per Session: Not on file  Stress:   . Feeling of Stress : Not on file  Social Connections:   . Frequency of Communication with Friends and Family: Not on file  . Frequency of Social Gatherings with Friends and Family: Not on file  . Attends Religious Services: Not on file  . Active Member of Clubs or Organizations: Not on file  . Attends Chief Technology Officer Meetings: Not on file  . Marital Status: Not on file    [facility] HIV PREP FLOWSHEET RESULTS 12/03/2019 05/31/2019 02/28/2019 11/30/2018 07/31/2018 05/03/2018 04/03/2018  Insurance Status Insured Insured Insured Insured Insured Insured Insured  How did you hear? - - - - - Primary referral Referral from primary  Gender at birth Male Male Male Male Male Male Male  Gender identity cis-Male cis-Male cis-Male cis-Male cis-Male cis-Male cis-Male  Risk for HIV - Condomless vaginal or anal intercourse;Hx of STI >5 partners in past 6 mos (regardless of condom use);Hx of STI;Condomless vaginal or anal intercourse >5 partners in past 6  mos (regardless of condom use);Condomless vaginal or anal intercourse In sexual relationship with HIV+ partner;Condomless vaginal or anal intercourse;>5 partners in past 6 mos (regardless of condom use) In sexual relationship with HIV+ partner;Condomless vaginal or anal intercourse;>5 partners in past 6 mos (regardless of condom use) Condomless vaginal or anal intercourse  Sex Partners Both men and women Both men and women Both men and women Both men and women Both men and women Both men and women Both men and women  # sex partners past 3-6 mos 1-3 1-3 1-3 1-3 1-3 1-3 4-6  Sex activity preferences Insertive;Oral Insertive and receptive;Oral Insertive;Oral Insertive;Oral  Insertive;Oral Insertive;Oral Insertive;Oral  Condom use Yes Yes Yes Yes Yes Yes Yes  % condom use 75 100 90 100 100 90 90  Partners genders and ages - - - - 4 20-24;M 25-29;F 30-49;F 25-29;F 20-24;M 30-49 M 20-24;M 25-29;F 20-24;F 25-29  Treated for STI? - No No No No No No  HIV symptoms? - N/A N/A N/A N/A N/A Swollen lymph nodes in your neck  PrEP Eligibility - Substantial risk for HIV Substantial risk for HIV Substantial risk for HIV HIV negative;CrCl >60 ml/min;Substantial risk for HIV HIV negative;CrCl >60 ml/min;Substantial risk for HIV HIV negative;CrCl >60 ml/min;Substantial risk for HIV  Paper work received? - - - - - No No    Labs:  SCr: Lab Results  Component Value Date   CREATININE 0.86 03/01/2020   CREATININE 1.05 02/27/2020   CREATININE 0.99 09/03/2019   CREATININE 0.99 05/30/2019   CREATININE 1.12 11/30/2018   HIV Lab Results  Component Value Date   HIV NON-REACTIVE 06/01/2020   HIV NON-REACTIVE 02/27/2020   HIV NON-REACTIVE 12/03/2019   HIV NON-REACTIVE 09/03/2019   HIV NON-REACTIVE 05/30/2019   Hepatitis B Lab Results  Component Value Date   HEPBSAB REACTIVE (A) 02/27/2019   HEPBSAG NON-REACTIVE 04/03/2018   Hepatitis C No results found for: HEPCAB, HCVRNAPCRQN Hepatitis A Lab Results  Component Value Date   HAV REACTIVE (A) 04/03/2018   RPR and STI Lab Results  Component Value Date   LABRPR NON-REACTIVE 06/01/2020   LABRPR NON-REACTIVE 12/03/2019   LABRPR NON-REACTIVE 05/30/2019   LABRPR NON-REACTIVE 11/30/2018   LABRPR NON-REACTIVE 04/03/2018    STI Results GC CT  06/01/2020 Negative Negative  06/01/2020 Negative Negative  12/03/2019 Negative Negative  12/03/2019 Negative Negative  06/20/2019 Negative Negative  05/30/2019 Negative Negative  05/30/2019 Negative Negative  05/30/2019 Negative Negative  11/30/2018 Negative Negative  11/30/2018 **POSITIVE**(A) Negative  04/03/2018 Negative Negative  04/03/2018 Negative Negative  04/03/2018 Negative  Negative  01/16/2018 - Negative  12/29/2017 - Negative    Assessment: Paul Beasley is here today for his 66-month PrEP follow up appointment. He continues to take Descovy every day with no side effects or issues. No problems with his insurance or getting it from the pharmacy. He has had 1 new partner since I last saw him and they did not use condoms.  He was having some urinary symptoms but those have resolved.  Will test him today and send in 3 more months of Descovy if negative.  I will have him follow up with Tammy Sours in 3 months while I am out and then schedule him an appointment with me for when I return.   Plan: - HIV antibody, BMET, RPR, urine cytology - Descovy x 3 months if HIV negative - F/u with Tammy Sours 2/24 at 11:15am - F/u with me again 5/23 at 1130am  Dalal Livengood L. Dimitri Dsouza, PharmD, BCIDP, AAHIVP, CPP Clinical  Pharmacist Practitioner Infectious Diseases Clinical Pharmacist [facility] 08/24/2020, 11:33 AM

## 2020-08-25 ENCOUNTER — Other Ambulatory Visit: Payer: Self-pay | Admitting: Pharmacist

## 2020-08-25 DIAGNOSIS — Z7253 High risk bisexual behavior: Secondary | ICD-10-CM

## 2020-08-25 LAB — BASIC METABOLIC PANEL
BUN: 15 mg/dL (ref 7–25)
CO2: 29 mmol/L (ref 20–32)
Calcium: 9.6 mg/dL (ref 8.6–10.3)
Chloride: 102 mmol/L (ref 98–110)
Creat: 1.04 mg/dL (ref 0.60–1.35)
Glucose, Bld: 82 mg/dL (ref 65–99)
Potassium: 4.2 mmol/L (ref 3.5–5.3)
Sodium: 139 mmol/L (ref 135–146)

## 2020-08-25 LAB — HIV ANTIBODY (ROUTINE TESTING W REFLEX): HIV 1&2 Ab, 4th Generation: NONREACTIVE

## 2020-08-25 LAB — RPR: RPR Ser Ql: NONREACTIVE

## 2020-08-25 LAB — URINE CYTOLOGY ANCILLARY ONLY
Chlamydia: NEGATIVE
Comment: NEGATIVE
Comment: NORMAL
Neisseria Gonorrhea: NEGATIVE

## 2020-08-25 MED ORDER — DESCOVY 200-25 MG PO TABS: 1.0000 | ORAL_TABLET | Freq: Every day | ORAL | 3 refills | Status: DC

## 2020-08-25 NOTE — Progress Notes (Signed)
Patient's HIV antibody is negative.  Will send in 4 more months of Descovy to [facility] to last him until he sees Tammy Sours in February.

## 2020-09-04 MED FILL — DESCOVY 200-25 MG TABS: 200-25 | 30 days supply | Qty: 30 | Fill #1

## 2020-09-29 MED FILL — DESCOVY 200-25 MG TABS: 200-25 | 30 days supply | Qty: 30 | Fill #2

## 2020-11-03 ENCOUNTER — Ambulatory Visit
Admission: EM | Admit: 2020-11-03 | Discharge: 2020-11-03 | Disposition: A | Discharge status: Home / Self Care | Payer: BC Managed Care – PPO | Attending: Emergency Medicine | Admitting: Emergency Medicine

## 2020-11-03 ENCOUNTER — Other Ambulatory Visit: Payer: Self-pay

## 2020-11-03 ENCOUNTER — Telehealth: Payer: Self-pay | Admitting: *Deleted

## 2020-11-03 DIAGNOSIS — Z113 Encounter for screening for infections with a predominantly sexual mode of transmission: Secondary | ICD-10-CM | POA: Diagnosis not present

## 2020-11-03 DIAGNOSIS — Z202 Contact with and (suspected) exposure to infections with a predominantly sexual mode of transmission: Secondary | ICD-10-CM | POA: Insufficient documentation

## 2020-11-03 MED ORDER — DOXYCYCLINE HYCLATE 100 MG PO CAPS: 100.0000 mg | ORAL_CAPSULE | Freq: Two times a day (BID) | ORAL | 0 refills | Status: AC

## 2020-11-03 NOTE — Telephone Encounter (Signed)
Patient called, asked to be seen for STI testing. Per Cassie, walk-in STI testing not available, advised patient to go to PCP, Urgent care, [facility], [facility], or [facility] for STI testing. Andree Coss, RN

## 2020-11-03 NOTE — ED Triage Notes (Signed)
Pt states had a chlamydia exposure. Denies sx's

## 2020-11-03 NOTE — ED Provider Notes (Signed)
[name]    CSN: [id number] Arrival date & time: 11/03/20  1433      History   Chief Complaint Chief Complaint  Patient presents with  . Exposure to STD    HPI Paul Beasley is a 30 y.o. male presenting today for STD screening. Reports that he had exposure to chlamydia. Denies any symptoms at this time.  Reports intercourse with males and females.  HPI  Past Medical History:  Diagnosis Date  . Migraine   . Testicular torsion     Patient Active Problem List   Diagnosis Date Noted  . 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 Frequent urination 12/29/2017  . Urethritis 12/29/2017  . 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 Elevated lactic acid level 07/02/2016  . AKI (acute kidney injury) (HCC) 07/02/2016  . Sepsis (HCC) 07/02/2016  . Tinea versicolor 06/21/2016  . URI (upper respiratory infection) 11/21/2015  . Routine general medical examination at a health care facility 01/22/2015    Past Surgical History:  Procedure Laterality Date  . ORCHIECTOMY Bilateral 06/24/2016   Procedure: SCROTAL EXPLORATION, RIGHT ORCHIOPEXY, LEFT ORCHIECTOMY;  Surgeon: Marcine Matar, MD;  Location: [facility];  Service: Urology;  Laterality: Bilateral;  . WISDOM TOOTH EXTRACTION         Home Medications    Prior to Admission medications   Medication Sig Start Date End Date Taking? Authorizing Provider  doxycycline (VIBRAMYCIN) 100 MG capsule Take 1 capsule (100 mg total) by mouth 2 (two) times daily for 7 days. 11/03/20 11/10/20 Yes Clara Smolen C, PA-C  emtricitabine-tenofovir AF (DESCOVY) 200-25 MG tablet Take 1 tablet by mouth daily. 08/25/20   Kuppelweiser, Cassie L, RPH-CPP  Turmeric (QC TUMERIC COMPLEX PO) Take by mouth.    [provider]    Family History Family History  Problem Relation Age of Onset  . Hypertension Mother   . Hypertension Paternal Grandmother   . Hypertension Paternal Grandfather   . Healthy Sister     Social History Social History    Tobacco Use  . Smoking status: Never Smoker  . Smokeless tobacco: Never Used  Substance Use Topics  . Alcohol use: Yes    Alcohol/week: 0.0 standard drinks    Comment: occ  . Drug use: No     Allergies   Patient has no known allergies.   Review of Systems Review of Systems  Constitutional: Negative for fever.  HENT: Negative for sore throat.   Respiratory: Negative for shortness of breath.   Cardiovascular: Negative for chest pain.  Gastrointestinal: Negative for abdominal pain, nausea and vomiting.  Genitourinary: Negative for difficulty urinating, dysuria, frequency, penile discharge, penile pain, penile swelling, scrotal swelling and testicular pain.  Skin: Negative for rash.  Neurological: Negative for dizziness, light-headedness and headaches.     Physical Exam Triage Vital Signs ED Triage Vitals [11/03/20 1539]  Enc Vitals Group     BP      Pulse      Resp      Temp      Temp src      SpO2      Weight      Height      Head Circumference      Peak Flow      Pain Score 0     Pain Loc      Pain Edu?      Excl. in GC?    No data found.  Updated Vital Signs BP (!) 178/68 (BP Location: Left Arm)  Pulse 80   Temp 98.8 F (37.1 C) (Oral)   Resp 18   SpO2 95%   Visual Acuity Right Eye Distance:   Left Eye Distance:   Bilateral Distance:    Right Eye Near:   Left Eye Near:    Bilateral Near:     Physical Exam Vitals and nursing note reviewed.  Constitutional:      Appearance: He is well-developed and well-nourished.     Comments: No acute distress  HENT:     Head: Normocephalic and atraumatic.     Nose: Nose normal.  Eyes:     Conjunctiva/sclera: Conjunctivae normal.  Cardiovascular:     Rate and Rhythm: Normal rate.  Pulmonary:     Effort: Pulmonary effort is normal. No respiratory distress.  Abdominal:     General: There is no distension.  Musculoskeletal:        General: Normal range of motion.     Cervical back: Neck supple.   Skin:    General: Skin is warm and dry.  Neurological:     Mental Status: He is alert and oriented to person, place, and time.  Psychiatric:        Mood and Affect: Mood and affect normal.      UC Treatments / Results  Labs (all labs ordered are listed, but only abnormal results are displayed) Labs Reviewed  CYTOLOGY, (ORAL, ANAL, URETHRAL) ANCILLARY ONLY    EKG   Radiology No results found.  Procedures Procedures (including critical care time)  Medications Ordered in UC Medications - No data to display  Initial Impression / Assessment and Plan / UC Course  I have reviewed the triage vital signs and the nursing notes.  Pertinent labs & imaging results that were available during my care of the patient were reviewed by me and considered in my medical decision making (see chart for details).     Urethral swab pending for screening for STDs, empirically treating for chlamydia with doxycycline twice daily x1 week.  Will call with results and provide further treatment if needed  Discussed strict return precautions. Patient verbalized understanding and is agreeable with plan.  Final Clinical Impressions(s) / UC Diagnoses   Final diagnoses:  Exposure to chlamydia  Screen for STD (sexually transmitted disease)     Discharge Instructions     Begin doxycycline twice daily for 1 week to treat chlamydia.  Please refrain from sexual activity for 10 days while medicine is clearing infection.  We are testing you for Gonorrhea, Chlamydia and Trichomonas. We will call you if anything is positive and let you know if you require any further treatment. Please inform partner of any positive results.  Please return if symptoms not improving with treatment, development of fever, nausea, vomiting, abdominal pain, scrotal pain.    ED Prescriptions    Medication Sig Dispense Auth. Provider   doxycycline (VIBRAMYCIN) 100 MG capsule Take 1 capsule (100 mg total) by mouth 2 (two)  times daily for 7 days. 14 capsule Lacosta Hargan, Cashion C, PA-C     PDMP not reviewed this encounter.   Lew Dawes, PA-C 11/03/20 1728

## 2020-11-03 NOTE — Discharge Instructions (Signed)
Begin doxycycline twice daily for 1 week to treat chlamydia.  Please refrain from sexual activity for 10 days while medicine is clearing infection.  We are testing you for Gonorrhea, Chlamydia and Trichomonas. We will call you if anything is positive and let you know if you require any further treatment. Please inform partner of any positive results.  Please return if symptoms not improving with treatment, development of fever, nausea, vomiting, abdominal pain, scrotal pain.

## 2020-11-04 LAB — CYTOLOGY, (ORAL, ANAL, URETHRAL) ANCILLARY ONLY
Chlamydia: NEGATIVE
Comment: NEGATIVE
Comment: NEGATIVE
Comment: NORMAL
Neisseria Gonorrhea: NEGATIVE
Trichomonas: NEGATIVE

## 2020-11-12 DIAGNOSIS — Z202 Contact with and (suspected) exposure to infections with a predominantly sexual mode of transmission: Secondary | ICD-10-CM | POA: Diagnosis not present

## 2020-11-12 DIAGNOSIS — R300 Dysuria: Secondary | ICD-10-CM | POA: Diagnosis not present

## 2020-12-10 ENCOUNTER — Ambulatory Visit (INDEPENDENT_AMBULATORY_CARE_PROVIDER_SITE_OTHER): Payer: Self-pay | Admitting: Family

## 2020-12-10 ENCOUNTER — Other Ambulatory Visit: Payer: Self-pay

## 2020-12-10 ENCOUNTER — Encounter: Payer: Self-pay | Admitting: Family

## 2020-12-10 VITALS — BP 135/90 | HR 80 | Temp 97.9°F | Ht 70.0 in | Wt 164.0 lb

## 2020-12-10 DIAGNOSIS — Z79899 Other long term (current) drug therapy: Secondary | ICD-10-CM

## 2020-12-10 DIAGNOSIS — Z7253 High risk bisexual behavior: Secondary | ICD-10-CM

## 2020-12-10 NOTE — Patient Instructions (Signed)
Nice to see you.  We will check your lab work today.  No further medication is necessary.   Recommend condom usage to reduce risk of STDs.   Please let us know if you would like to restart PrEP at any time.  Have a great day and stay safe!

## 2020-12-10 NOTE — Assessment & Plan Note (Signed)
Mr. Goynes stopped taking his Descovy 2-3 weeks ago and is currently in a stable, monogamous relationship and wishes to remain off PrEP at this time. Will check HIV and STI today. Discussed the risks and benefits of PrEP and encouraged condom usage in the future to reduce the risk of HIV and STI. Follow up as needed.

## 2020-12-10 NOTE — Progress Notes (Signed)
Subjective:    Patient ID: Paul Beasley, male    DOB: Nov 24, 1990, 30 y.o.   MRN: [id number]  Chief Complaint  Patient presents with  . Follow-up    Would like to stop descovy     HPI:  Paul Beasley is a 30 y.o. male on prophylaxis for HIV with Descovy last seen on 08/24/2020 by Aggie Cosier, RPH-CPP with good adherence and tolerance to his medication.  HIV and STI testing were negative.  Here today for routine follow-up.  Paul Beasley stopped taking his Descovy about 2-3 weeks ago. Prior to that he had no adverse side effects or missed doses since his last office visit.  Overall feeling well today with no new concerns/complaints. Denies fevers, chills, night sweats, headaches, changes in vision, neck pain/stiffness, nausea, diarrhea, vomiting, lesions or rashes.  He has no problems obtaining medication from the pharmacy. Now in a monogamous relationship and wishing to stop taking PrEP.    No Known Allergies    Outpatient Medications Prior to Visit  Medication Sig Dispense Refill  . emtricitabine-tenofovir AF (DESCOVY) 200-25 MG tablet Take 1 tablet by mouth daily. 30 tablet 3  . Turmeric (QC TUMERIC COMPLEX PO) Take by mouth.     No facility-administered medications prior to visit.     Past Medical History:  Diagnosis Date  . Migraine   . Testicular torsion      Past Surgical History:  Procedure Laterality Date  . ORCHIECTOMY Bilateral 06/24/2016   Procedure: SCROTAL EXPLORATION, RIGHT ORCHIOPEXY, LEFT ORCHIECTOMY;  Surgeon: Marcine Matar, MD;  Location: [facility];  Service: Urology;  Laterality: Bilateral;  . WISDOM TOOTH EXTRACTION         Review of Systems  Constitutional: Negative for appetite change, chills, fatigue, fever and unexpected weight change.  Eyes: Negative for visual disturbance.  Respiratory: Negative for cough, chest tightness, shortness of breath and wheezing.   Cardiovascular: Negative for chest pain and leg swelling.   Gastrointestinal: Negative for abdominal pain, constipation, diarrhea, nausea and vomiting.  Genitourinary: Negative for dysuria, flank pain, frequency, genital sores, hematuria and urgency.  Skin: Negative for rash.  Allergic/Immunologic: Negative for immunocompromised state.  Neurological: Negative for dizziness and headaches.      Objective:    BP 135/90   Pulse 80   Temp 97.9 F (36.6 C) (Oral)   Ht 5\' 10"  (1.778 m)   Wt 164 lb (74.4 kg)   BMI 23.53 kg/m  Nursing note and vital signs reviewed.  Physical Exam Constitutional:      General: He is not in acute distress.    Appearance: He is well-developed.  HENT:     Mouth/Throat:     Mouth: Oropharynx is clear and moist.  Eyes:     Conjunctiva/sclera: Conjunctivae normal.  Cardiovascular:     Rate and Rhythm: Normal rate and regular rhythm.     Pulses: Intact distal pulses.     Heart sounds: Normal heart sounds. No murmur heard. No friction rub. No gallop.   Pulmonary:     Effort: Pulmonary effort is normal. No respiratory distress.     Breath sounds: Normal breath sounds. No wheezing or rales.  Chest:     Chest wall: No tenderness.  Abdominal:     General: Bowel sounds are normal.     Palpations: Abdomen is soft.     Tenderness: There is no abdominal tenderness.  Musculoskeletal:     Cervical back: Neck supple.  Lymphadenopathy:     Cervical: No cervical  adenopathy.  Skin:    General: Skin is warm and dry.     Findings: No rash.  Neurological:     Mental Status: He is alert and oriented to person, place, and time.  Psychiatric:        Mood and Affect: Mood and affect normal.        Behavior: Behavior normal.        Thought Content: Thought content normal.        Judgment: Judgment normal.      Depression screen [facility] 2/9 04/13/2018 01/16/2018  Decreased Interest 0 0  Down, Depressed, Hopeless 0 0  PHQ - 2 Score 0 0       Assessment & Plan:    Patient Active Problem List   Diagnosis Date Noted  .  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 Frequent urination 12/29/2017  . Urethritis 12/29/2017  . 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 Elevated lactic acid level 07/02/2016  . AKI (acute kidney injury) (HCC) 07/02/2016  . Sepsis (HCC) 07/02/2016  . Tinea versicolor 06/21/2016  . URI (upper respiratory infection) 11/21/2015  . Routine general medical examination at a health care facility 01/22/2015     Problem List Items Addressed This Visit      Other   High risk bisexual behavior - Primary    Mr. Huge stopped taking his Descovy 2-3 weeks ago and is currently in a stable, monogamous relationship and wishes to remain off PrEP at this time. Will check HIV and STI today. Discussed the risks and benefits of PrEP and encouraged condom usage in the future to reduce the risk of HIV and STI. Follow up as needed.       Relevant Orders   HIV antibody (with reflex)   Cytology (oral, anal, urethral) ancillary only   Cytology (oral, anal, urethral) ancillary only   RPR   Urine cytology ancillary only([place])    Other Visit Diagnoses    On pre-exposure prophylaxis for HIV       Relevant Orders   HIV antibody (with reflex)   Cytology (oral, anal, urethral) ancillary only   Cytology (oral, anal, urethral) ancillary only   RPR   Urine cytology ancillary only([place])       I have discontinued Paul Beasley's Descovy. I am also having him maintain his Turmeric (QC TUMERIC COMPLEX PO).   Follow-up: As needed   Marcos Eke, MSN, FNP-C Nurse Practitioner [facility] Main number: [phone]

## 2020-12-11 LAB — URINE CYTOLOGY ANCILLARY ONLY
Chlamydia: NEGATIVE
Comment: NEGATIVE
Comment: NORMAL
Neisseria Gonorrhea: NEGATIVE

## 2020-12-11 LAB — HIV ANTIBODY (ROUTINE TESTING W REFLEX): HIV 1&2 Ab, 4th Generation: NONREACTIVE

## 2020-12-11 LAB — CYTOLOGY, (ORAL, ANAL, URETHRAL) ANCILLARY ONLY
Chlamydia: NEGATIVE
Chlamydia: NEGATIVE
Comment: NEGATIVE
Comment: NEGATIVE
Comment: NORMAL
Comment: NORMAL
Neisseria Gonorrhea: NEGATIVE
Neisseria Gonorrhea: NEGATIVE

## 2020-12-11 LAB — RPR: RPR Ser Ql: NONREACTIVE

## 2021-01-14 ENCOUNTER — Other Ambulatory Visit (HOSPITAL_COMMUNITY): Payer: Self-pay

## 2021-01-21 ENCOUNTER — Other Ambulatory Visit (HOSPITAL_COMMUNITY): Payer: Self-pay

## 2021-03-08 ENCOUNTER — Ambulatory Visit: Payer: Self-pay | Admitting: Pharmacist

## 2021-09-08 IMAGING — CT CT HEAD W/O CM
3 series · 15 of 47 positions shown, 18 images · non-contrast
Comparison: None.

CLINICAL DATA: Left arm numbness for 2-3 days, no injury

EXAM:
CT HEAD WITHOUT CONTRAST
TECHNIQUE: Contiguous axial images were obtained from the base of the skull
through the vertex without intravenous contrast.

[Series 2: head wo · axial · 0.45mm/px · z∈[+12,+147]mm · 9 of 33 slices shown, 12 images]
[im 3/33  brain]
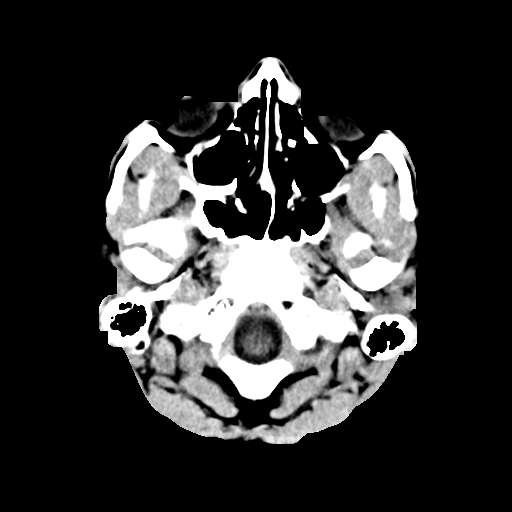
[im 3/33  bone]
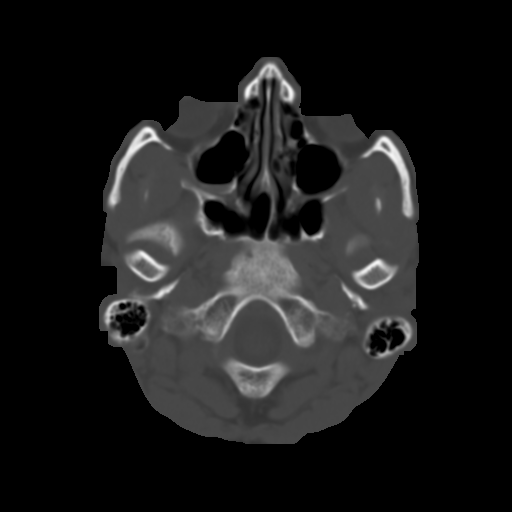
[im 6/33  brain]
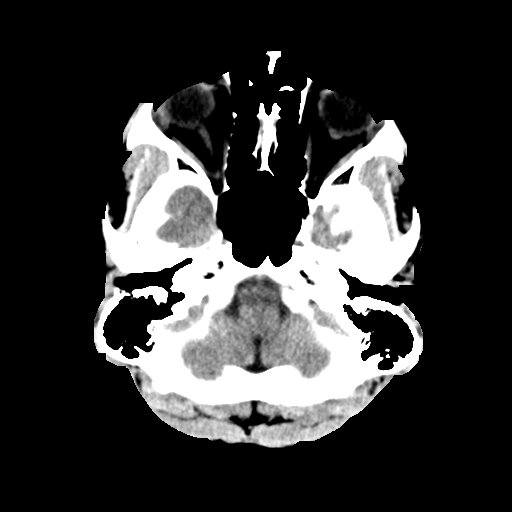
[im 9/33  brain]
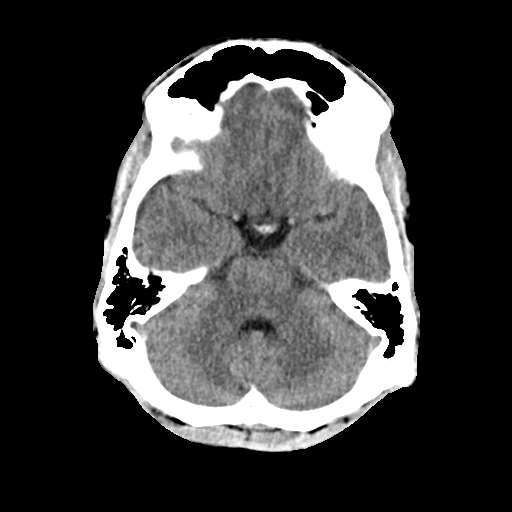
[im 13/33  brain]
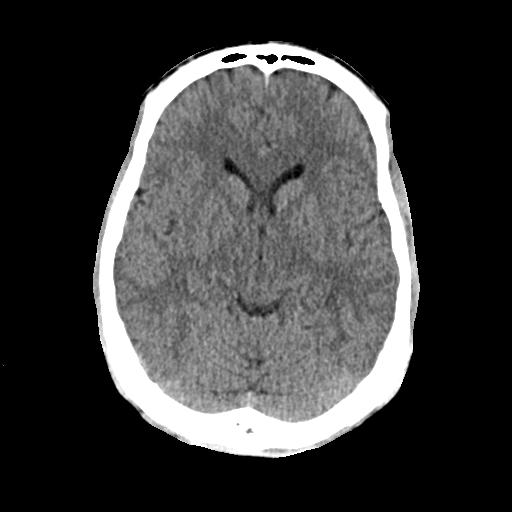
[im 17/33  brain]
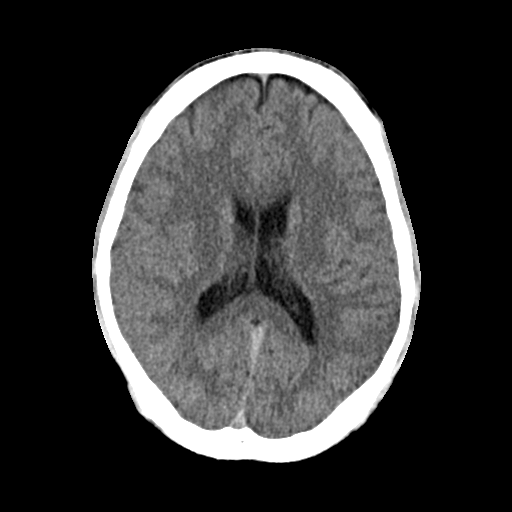
[im 17/33  bone]
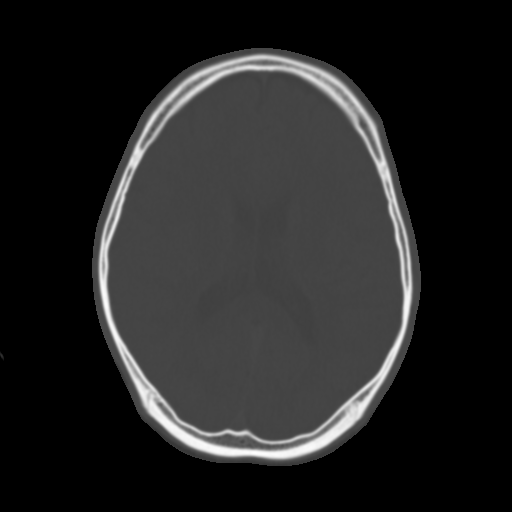
[im 20/33  brain]
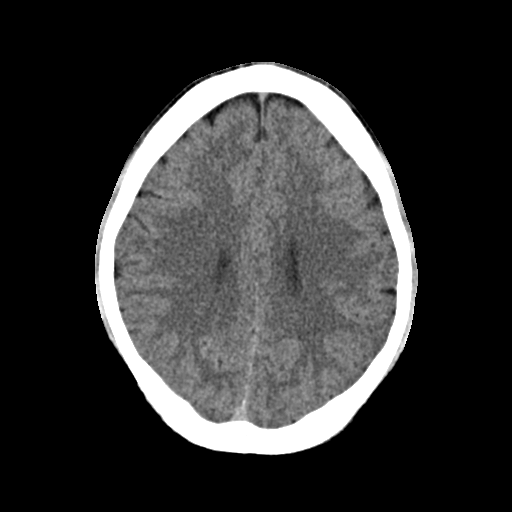
[im 24/33  brain]
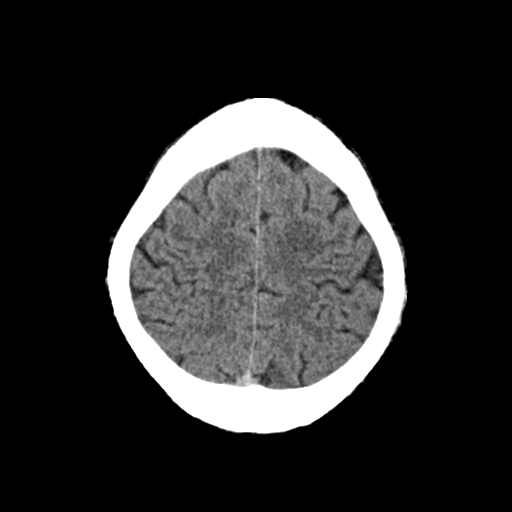
[im 27/33  brain]
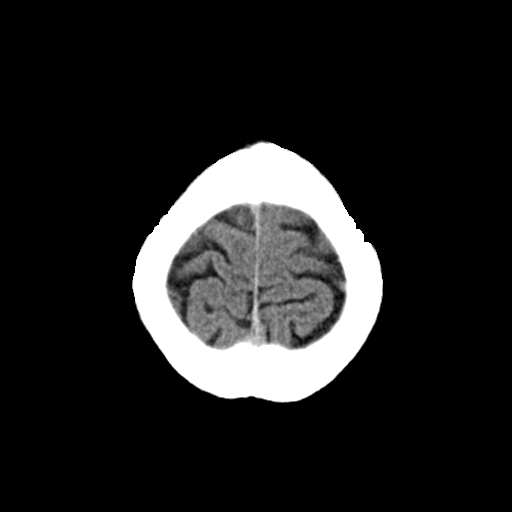
[im 30/33  brain]
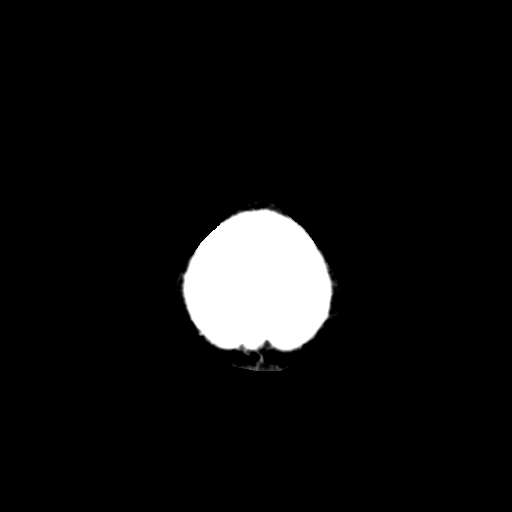
[im 30/33  bone]
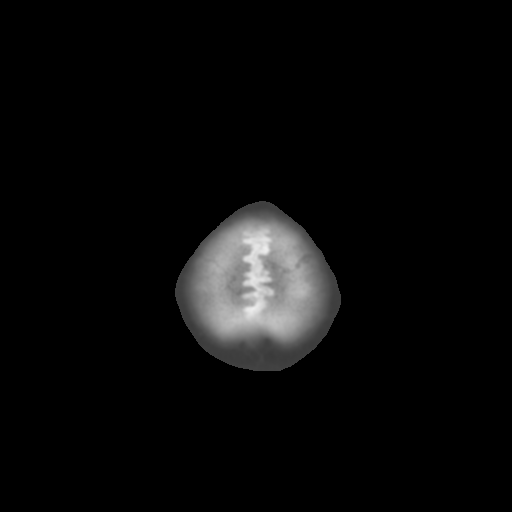

[Series 4: coronal soft · coronal · 0.33mm/px · 3 of 73 slices shown]
[im 25/73  brain]
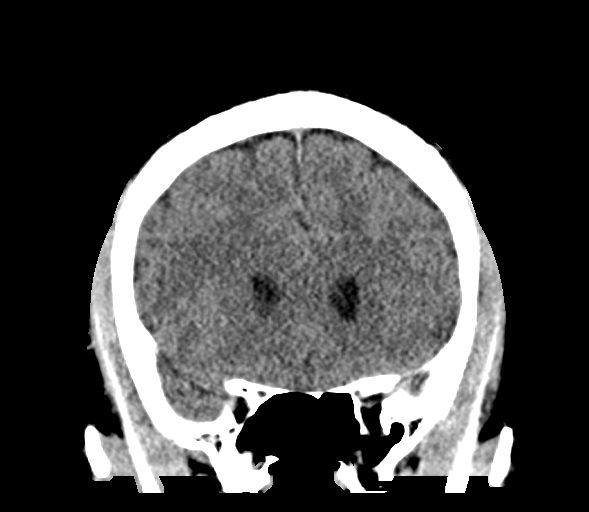
[im 33/73  brain]
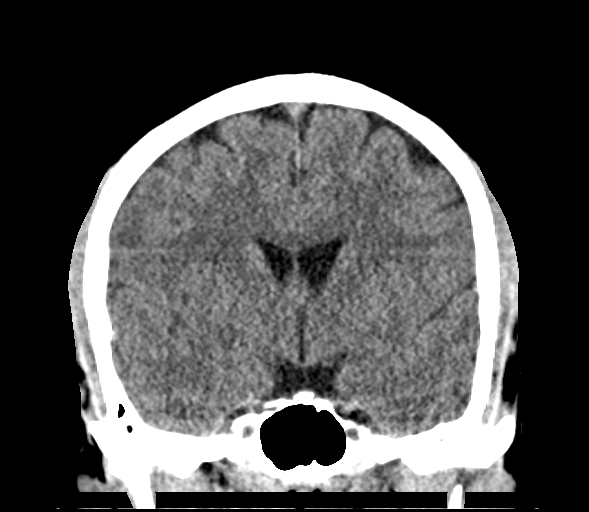
[im 41/73  brain]
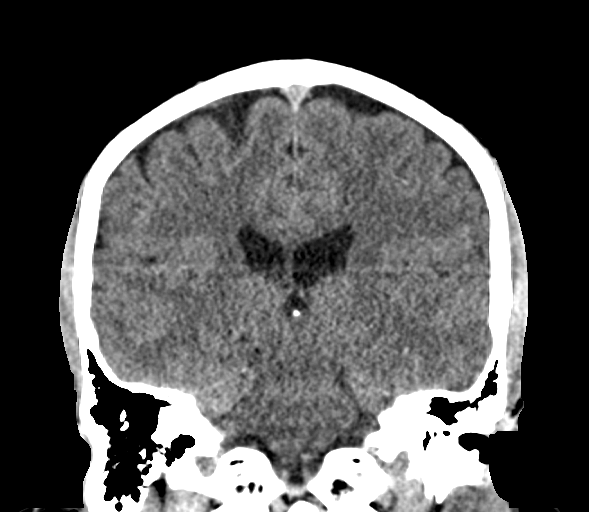

[Series 5: sag soft · sagittal · 0.33mm/px · 3 of 58 slices shown]
[im 20/58  brain]
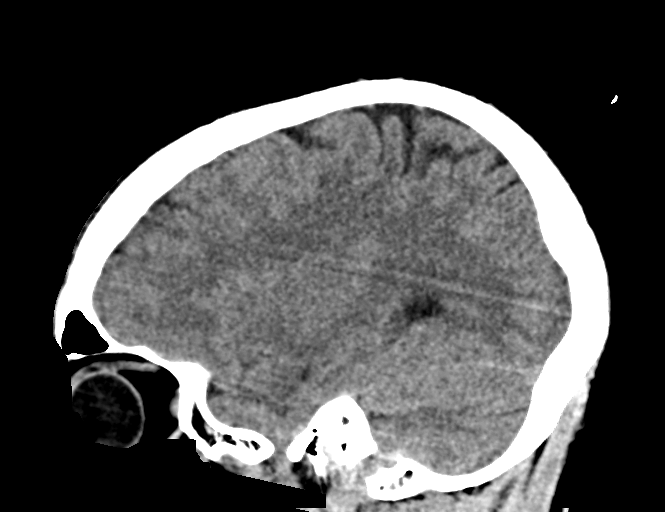
[im 29/58  brain]
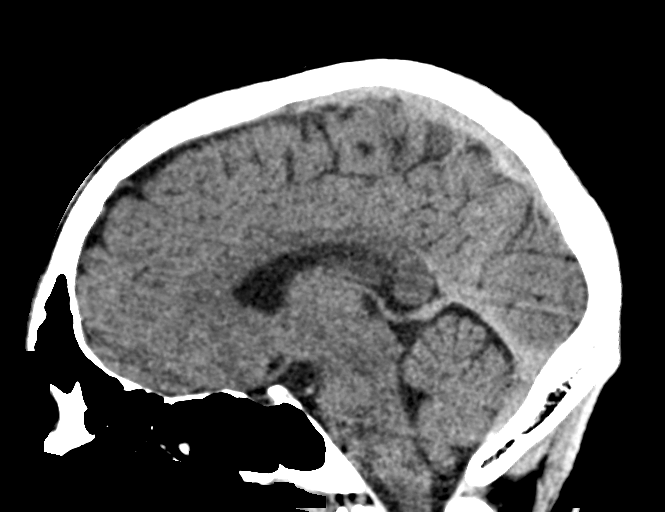
[im 39/58  brain]
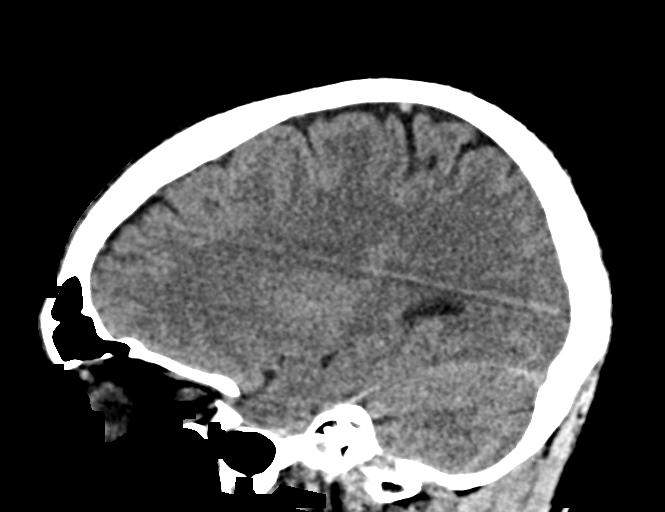

[15 of 47 positions shown; findings below may reference images not displayed]

FINDINGS: Brain: No evidence of acute infarction, hemorrhage, hydrocephalus,
extra-axial collection or mass lesion/mass effect.

Vascular: No hyperdense vessel or unexpected calcification.

Skull: No calvarial fracture or suspicious osseous lesion. No scalp
swelling or hematoma.

Sinuses/Orbits: Fluid level seen in the right maxillary sinus.
Remaining paranasal sinuses are predominantly clear. Mastoid air
cells are well aerated. Debris in the right external auditory canal.
Included orbital structures are unremarkable.

Other: None
IMPRESSION: 1. No acute intracranial abnormality.
2. Fluid level in the right maxillary sinus. Correlate for clinical
features of acute sinusitis.
3. Debris in the right external auditory canal, correlate with
visual inspection for cerumen impaction.

## 2023-04-19 ENCOUNTER — Encounter: Payer: Self-pay | Admitting: Pharmacist

## 2023-04-19 ENCOUNTER — Other Ambulatory Visit (HOSPITAL_COMMUNITY): Payer: Self-pay

## 2023-04-19 ENCOUNTER — Other Ambulatory Visit (HOSPITAL_COMMUNITY)
Admission: RE | Admit: 2023-04-19 | Discharge: 2023-04-19 | Disposition: A | Discharge status: Home / Self Care | Payer: No Typology Code available for payment source | Source: Ambulatory Visit | Attending: Family | Admitting: Family

## 2023-04-19 ENCOUNTER — Ambulatory Visit (INDEPENDENT_AMBULATORY_CARE_PROVIDER_SITE_OTHER): Payer: No Typology Code available for payment source | Admitting: Pharmacist

## 2023-04-19 ENCOUNTER — Other Ambulatory Visit: Payer: Self-pay

## 2023-04-19 DIAGNOSIS — Z2981 Encounter for HIV pre-exposure prophylaxis: Secondary | ICD-10-CM | POA: Diagnosis not present

## 2023-04-19 DIAGNOSIS — Z113 Encounter for screening for infections with a predominantly sexual mode of transmission: Secondary | ICD-10-CM

## 2023-04-19 DIAGNOSIS — Z7253 High risk bisexual behavior: Secondary | ICD-10-CM

## 2023-04-19 DIAGNOSIS — Z79899 Other long term (current) drug therapy: Secondary | ICD-10-CM

## 2023-04-19 DIAGNOSIS — Z0000 Encounter for general adult medical examination without abnormal findings: Secondary | ICD-10-CM

## 2023-04-19 LAB — CBC
HCT: 40.5 % (ref 38.5–50.0)
Hemoglobin: 13.8 g/dL (ref 13.2–17.1)
MCH: 29.7 pg (ref 27.0–33.0)
MCV: 87.1 fL (ref 80.0–100.0)
RDW: 12.9 % (ref 11.0–15.0)

## 2023-04-19 MED ORDER — DESCOVY 200-25 MG PO TABS
1.0000 | ORAL_TABLET | Freq: Every day | ORAL | 2 refills | Status: DC
Start: 2023-04-19 — End: 2024-01-26

## 2023-04-19 NOTE — Progress Notes (Signed)
Date:  04/19/2023   HPI: Paul Beasley is a 32 y.o. male who presents to the [facility] pharmacy clinic for HIV PrEP follow-up.  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   Elevated lactic acid level 07/02/2016   AKI (acute kidney injury) (HCC) 07/02/2016   Sepsis (HCC) 07/02/2016   Tinea versicolor 06/21/2016   URI (upper respiratory infection) 11/21/2015   Routine general medical examination at a health care facility 01/22/2015    Patient's Medications   No medications on file    Allergies: No Known Allergies  Past Medical History: Past Medical History:  Diagnosis Date   Migraine    Testicular torsion     Social History: Social History   Socioeconomic History   Marital status: Single    Spouse name: Not on file   Number of children: Not on file   Years of education: Not on file   Highest education level: Not on file  Occupational History   Occupation: Chief Technology Officer  Tobacco Use   Smoking status: Never   Smokeless tobacco: Never  Substance and Sexual Activity   Alcohol use: Yes    Alcohol/week: 0.0 standard drinks of alcohol    Comment: occ   Drug use: No   Sexual activity: Yes    Partners: Female, Male    Comment: offered condoms  Other Topics Concern   Not on file  Social History Narrative   Not on file   Social Determinants of Health   Financial Resource Strain: Not on file  Food Insecurity: Not on file  Transportation Needs: Not on file  Physical Activity: Not on file  Stress: Not on file  Social Connections: Not on file       12/03/2019   11:40 AM 05/31/2019    1:47 PM 02/28/2019    1:38 PM 11/30/2018   12:00 PM 07/31/2018   10:46 AM 05/03/2018   11:10 AM 04/03/2018   11:08 AM  CHL HIV PREP FLOWSHEET RESULTS  Insurance Status Insured Insured  Insured Insured Insured Insured Insured  How did you hear?      Primary referral Referral from primary  Gender at birth Male Male Male Male Male Male Male  Gender identity cis-Male cis-Male cis-Male cis-Male cis-Male cis-Male cis-Male  Risk for HIV  Condomless vaginal or anal intercourse;Hx of STI >5 partners in past 6 mos (regardless of condom use);Hx of STI;Condomless vaginal or anal intercourse >5 partners in past 6 mos (regardless of condom use);Condomless vaginal or anal intercourse In sexual relationship with HIV+ partner;Condomless vaginal or anal intercourse;>5 partners in past 6 mos (regardless of condom use) In sexual relationship with HIV+ partner;Condomless vaginal or anal intercourse;>5 partners in past 6 mos (regardless of condom use) Condomless vaginal or anal intercourse  Sex Partners Both men and women Both men and women Both men and women Both men and women Both men and women Both men and women Both men and women  # sex partners past 3-6 mos 1-3 1-3 1-3 1-3 1-3 1-3 4-6  Sex activity preferences Insertive;Oral Insertive and receptive;Oral Insertive;Oral Insertive;Oral Insertive;Oral Insertive;Oral Insertive;Oral  Condom use Yes Yes Yes Yes Yes Yes Yes  % condom use 75 100 90 100 100 90 105  Partners genders and ages      56  20-24;M 25-29;F 30-49;F 25-29;F 20-24;M 30-49 M 20-24;M 25-29;F 20-24;F 25-29  Treated for STI?  No No No No No No  HIV symptoms?  N/A N/A N/A N/A N/A Swollen lymph nodes in your neck  PrEP Eligibility  Substantial risk for HIV Substantial risk for HIV Substantial risk for HIV HIV negative;CrCl >60 ml/min;Substantial risk for HIV HIV negative;CrCl >60 ml/min;Substantial risk for HIV HIV negative;CrCl >60 ml/min;Substantial risk for HIV  Paper work received?      No No    Labs:  SCr: Lab Results  Component Value Date   CREATININE 1.04 08/24/2020   CREATININE 0.86 03/01/2020   CREATININE 1.05 02/27/2020   CREATININE 0.99 09/03/2019   CREATININE 0.99  05/30/2019   HIV Lab Results  Component Value Date   HIV NON-REACTIVE 12/10/2020   HIV NON-REACTIVE 08/24/2020   HIV NON-REACTIVE 06/01/2020   HIV NON-REACTIVE 02/27/2020   HIV NON-REACTIVE 12/03/2019   Hepatitis B Lab Results  Component Value Date   HEPBSAB REACTIVE (A) 02/27/2019   HEPBSAG NON-REACTIVE 04/03/2018   Hepatitis C No results found for: "HEPCAB", "HCVRNAPCRQN" Hepatitis A Lab Results  Component Value Date   HAV REACTIVE (A) 04/03/2018   RPR and STI Lab Results  Component Value Date   LABRPR NON-REACTIVE 12/10/2020   LABRPR NON-REACTIVE 08/24/2020   LABRPR NON-REACTIVE 06/01/2020   LABRPR NON-REACTIVE 12/03/2019   LABRPR NON-REACTIVE 05/30/2019    STI Results GC CT  12/10/2020 11:42 AM Negative    Negative    Negative  Negative    Negative    Negative   11/03/2020  4:28 PM Negative  Negative   08/24/2020 11:48 AM Negative  Negative   06/01/2020 11:54 AM Negative    Negative  Negative    Negative   12/03/2019 12:00 PM Negative    Negative  Negative    Negative   06/20/2019 12:00 AM Negative  Negative   05/30/2019 12:00 AM Negative    Negative    Negative  Negative    Negative    Negative   11/30/2018 12:00 AM **POSITIVE**    Negative  Negative    Negative   04/03/2018 12:00 AM Negative    Negative    Negative  Negative    Negative    Negative   01/16/2018  5:00 PM  Negative   12/29/2017  4:37 PM  Negative     Assessment: Paul Beasley is here to restart HIV PrEP. He was previously a patient here and was last seen in February 2022 by Marcos Eke, NP. He was in a monogamous relationship at that time and elected to stop taking PrEP. He is no longer in that relationship so he wishes to restart. He was previously on Descovy and tolerated it well. Discussed that we now have a new option available in the form of an injection. He is interested in pursuing that option. Discussed the need to come in every 2 months for injection appointments and that  staying within his target window is extremely important. Discussed that injection site pain is common and usually mild to moderate in nature. Discussed the option for oral bridging if needed and to let [place] know about any planned travel or missed injections ahead of time. Also asked him to let us know ASAP of any insurance changes. Will look into his insurance to see if it is covered.   Screened for acute HIV symptoms such as fatigue, muscle aches, rash, sore throat, lymphadenopathy, headache, night sweats, nausea/vomiting/diarrhea, and fever. Denies any symptoms.  No symptoms of any STIs today either and he agrees to full STI testing today with RPR and oral/urine/rectal cytologies. He is also requesting to have his hemoglobin and iron checked as he is feeling more fatigued lately. I will update his lab work and start him on Descovy while we are waiting on insurance approval for Apretude. All questions answered.  Plan: - HIV antibody, RPR, CMET, CBC, iron panel, lipid panel, and urine/rectal/pharyngeal cytologies for GC/chlamydia today - Descovy x 3 months if HIV negative - Start benefits investigation for Apretude - Follow up with me in 3 months or sooner if starting Apretude; appointment tentatively scheduled for 07/13/23  Hovanes Hymas L. Jannette Fogo, PharmD, BCIDP, AAHIVP, CPP Clinical Pharmacist Practitioner Infectious Diseases Clinical Pharmacist [facility] 04/19/2023, 9:01 AM

## 2023-04-20 LAB — COMPREHENSIVE METABOLIC PANEL
ALT: 14 U/L (ref 9–46)
AST: 19 U/L (ref 10–40)
Calcium: 10.3 mg/dL (ref 8.6–10.3)
Creat: 1.1 mg/dL (ref 0.60–1.24)

## 2023-04-20 LAB — HIV ANTIBODY (ROUTINE TESTING W REFLEX): HIV 1&2 Ab, 4th Generation: NONREACTIVE

## 2023-04-20 LAB — LIPID PANEL
Cholesterol: 252 mg/dL — ABNORMAL HIGH (ref <200)
LDL Cholesterol (Calc): 185 mg/dL (calc) — ABNORMAL HIGH
Non-HDL Cholesterol (Calc): 205 mg/dL (calc) — ABNORMAL HIGH (ref <130)
Triglycerides: 86 mg/dL (ref <150)

## 2023-04-20 LAB — IRON,TIBC AND FERRITIN PANEL: Ferritin: 78 ng/mL (ref 38–380)

## 2023-04-21 LAB — IRON,TIBC AND FERRITIN PANEL
%SAT: 26 % (calc) (ref 20–48)
Ferritin: 78 ng/mL (ref 38–380)
TIBC: 394 mcg/dL (calc) (ref 250–425)

## 2023-04-21 LAB — COMPREHENSIVE METABOLIC PANEL
AG Ratio: 1.7 (calc) (ref 1.0–2.5)
Albumin: 5.2 g/dL — ABNORMAL HIGH (ref 3.6–5.1)
Alkaline phosphatase (APISO): 67 U/L (ref 36–130)
BUN: 15 mg/dL (ref 7–25)
CO2: 29 mmol/L (ref 20–32)
Chloride: 100 mmol/L (ref 98–110)
Globulin: 3.1 g/dL (calc) (ref 1.9–3.7)
Glucose, Bld: 85 mg/dL (ref 65–99)
Potassium: 4 mmol/L (ref 3.5–5.3)
Sodium: 138 mmol/L (ref 135–146)
Total Bilirubin: 0.8 mg/dL (ref 0.2–1.2)
Total Protein: 8.3 g/dL — ABNORMAL HIGH (ref 6.1–8.1)

## 2023-04-21 LAB — CBC
MCHC: 34.1 g/dL (ref 32.0–36.0)
MPV: 10.3 fL (ref 7.5–12.5)
Platelets: 200 10*3/uL (ref 140–400)
RBC: 4.65 10*6/uL (ref 4.20–5.80)
WBC: 2.7 10*3/uL — ABNORMAL LOW (ref 3.8–10.8)

## 2023-04-21 LAB — LIPID PANEL
HDL: 47 mg/dL (ref >=40)
Total CHOL/HDL Ratio: 5.4 (calc) — ABNORMAL HIGH (ref <5.0)

## 2023-04-21 LAB — CYTOLOGY, (ORAL, ANAL, URETHRAL) ANCILLARY ONLY
Chlamydia: NEGATIVE
Chlamydia: NEGATIVE
Comment: NEGATIVE
Comment: NEGATIVE
Comment: NORMAL
Comment: NORMAL
Neisseria Gonorrhea: NEGATIVE
Neisseria Gonorrhea: NEGATIVE

## 2023-04-21 LAB — URINE CYTOLOGY ANCILLARY ONLY
Chlamydia: NEGATIVE
Comment: NEGATIVE
Comment: NORMAL
Neisseria Gonorrhea: NEGATIVE

## 2023-04-21 LAB — RPR: RPR Ser Ql: NONREACTIVE

## 2023-04-24 ENCOUNTER — Other Ambulatory Visit: Payer: Self-pay | Admitting: Pharmacist

## 2023-04-24 ENCOUNTER — Other Ambulatory Visit (HOSPITAL_COMMUNITY): Payer: Self-pay

## 2023-04-24 MED ORDER — APRETUDE 600 MG/3ML IM SUER: 600.0000 mg | INTRAMUSCULAR | 1 refills | Status: DC

## 2023-04-24 MED ORDER — APRETUDE 600 MG/3ML IM SUER: 600.0000 mg | INTRAMUSCULAR | 5 refills | Status: DC

## 2023-05-02 ENCOUNTER — Telehealth: Payer: Self-pay

## 2023-05-02 NOTE — Telephone Encounter (Signed)
[facility] Patient Advocate Encounter   Was successful in obtaining a [employer] copay card for Apretude.  This copay card will make the patients copay $0.00.  I have spoken with the patient.    The billing information is as follows and has been shared with [facility].               Paul Beasley, CPhT Specialty Pharmacy Patient [facility] Phone: [phone] Fax:  [phone]

## 2023-05-03 ENCOUNTER — Telehealth: Payer: Self-pay

## 2023-05-03 NOTE — Telephone Encounter (Signed)
[facility] Patient Advocate Encounter  Patient's medication (Apretude) have been couriered to [facility] from [facility] and will be administered on the patient next office visit on 05/04/23.  Clearance Coots , CPhT Specialty Pharmacy Patient [facility] for [facility] Phone: [phone] Fax:  [phone]

## 2023-05-04 ENCOUNTER — Ambulatory Visit (INDEPENDENT_AMBULATORY_CARE_PROVIDER_SITE_OTHER): Payer: No Typology Code available for payment source | Admitting: Pharmacist

## 2023-05-04 ENCOUNTER — Other Ambulatory Visit: Payer: Self-pay

## 2023-05-04 DIAGNOSIS — Z2981 Encounter for HIV pre-exposure prophylaxis: Secondary | ICD-10-CM

## 2023-05-04 DIAGNOSIS — Z79899 Other long term (current) drug therapy: Secondary | ICD-10-CM

## 2023-05-04 MED ORDER — CABOTEGRAVIR ER 600 MG/3ML IM SUER
600.0000 mg | Freq: Once | INTRAMUSCULAR | Status: AC
Start: 2023-05-04 — End: 2023-05-04
  Administered 2023-05-04: 600 mg via INTRAMUSCULAR

## 2023-05-04 NOTE — Progress Notes (Signed)
HPI: Paul Beasley is a 32 y.o. male who presents to the [facility] pharmacy clinic for Apretude administration and HIV PrEP follow up.  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   CABOTEGRAVIR ER (APRETUDE) 600 MG/3ML INJECTION    Inject 3 mLs (600 mg total) into the muscle every 2 (two) months.   EMTRICITABINE-TENOFOVIR AF (DESCOVY) 200-25 MG TABLET    Take 1 tablet by mouth daily.  Modified Medications   No medications on file  Discontinued Medications   No medications on file    Allergies: No Known Allergies  Labs: Lab Results  Component Value Date   HIV1RNAQUANT <20 NOT DETECTED 03/24/2020   HIV1RNAQUANT <20 NOT DETECTED 01/30/2018    RPR and STI Lab Results  Component Value Date   LABRPR NON-REACTIVE 04/19/2023   LABRPR NON-REACTIVE 12/10/2020   LABRPR NON-REACTIVE 08/24/2020   LABRPR NON-REACTIVE 06/01/2020   LABRPR NON-REACTIVE 12/03/2019    STI Results GC CT  04/19/2023  9:11 AM Negative    Negative    Negative  Negative    Negative    Negative   12/10/2020 11:42 AM Negative    Negative    Negative  Negative    Negative    Negative   11/03/2020  4:28 PM Negative  Negative   08/24/2020 11:48 AM Negative  Negative   06/01/2020 11:54 AM Negative    Negative  Negative    Negative    12/03/2019 12:00 PM Negative    Negative  Negative    Negative   06/20/2019 12:00 AM Negative  Negative   05/30/2019 12:00 AM Negative    Negative    Negative  Negative    Negative    Negative   11/30/2018 12:00 AM **POSITIVE**    Negative  Negative    Negative   04/03/2018 12:00 AM Negative    Negative    Negative  Negative    Negative    Negative   01/16/2018  5:00 PM  Negative   12/29/2017  4:37 PM  Negative     Hepatitis B Lab Results  Component Value Date   HEPBSAB REACTIVE (A) 02/27/2019   HEPBSAG NON-REACTIVE 04/03/2018   Hepatitis C No results found for: "HEPCAB", "HCVRNAPCRQN" Hepatitis A Lab Results  Component Value Date   HAV REACTIVE (A) 04/03/2018   Lipids: Lab Results  Component Value Date   CHOL 252 (H) 04/19/2023   TRIG 86 04/19/2023   HDL 47 04/19/2023   CHOLHDL 5.4 (H) 04/19/2023   LDLCALC 185 (H) 04/19/2023    Current PrEP Regimen: Descovy  TARGET DATE: The 18th  Assessment: Paul Beasley presents today for their first initiation injection of Apretude and to follow up for HIV PrEP.  Counseled that Apretude is one intramuscular injection in the gluteal  muscle for each visit. Explained that the second injection is 30 days after the initial injection then every 2 months thereafter. Discussed follow up appointments moving forward. Screened for acute HIV symptoms such as fatigue, muscle aches, rash, sore throat, lymphadenopathy, headache, night sweats, nausea/vomiting/diarrhea, and fever. Denies any symptoms.  No known exposures to any STIs since last visit. Agrees to full STI testing today with RPR and oral/urine/rectal cytologies.   Explained that showing up to injection appointments is very important and warned that if appointments are missed, protection will be minimal and the risk of acquiring HIV becomes much higher. Counseled on possible side effects associated with the injections such as injection site pain, which is usually mild to moderate in  nature, injection site nodules, and injection site reactions. Asked to call the clinic or send me a mychart message if they experience any issues. Advised that he can take Motrin or Tylenol for injection site pain if needed. He may also pre-treat with Motrin or Tylenol 30-45 minutes before scheduled appointments.   Per [employer] guidelines, a rapid HIV test should be drawn prior to Apretude administration. Due to state shortage of rapid HIV tests, this is temporarily unable to be done. Per decision from [facility] physicians, we will proceed with Apretude administration at this time without a negative rapid HIV test beforehand. HIV RNA was collected today and is in process.  Administered cabotegravir 600mg /52mL in right upper outer quadrant of the gluteal muscle. Monitored patient for 10 minutes after injection. Injection was tolerated well without issue. Counseled to stop taking Descovy after today's dose and to call with any issues that may arise. Will make follow up appointments for second initiation injection in 30 days and then maintenance injections every 2 months thereafter.   Plan: - Stop Descovy after today  - HIV RNA today - First Apretude injection administered - Second initiation injection scheduled for 06/01/23 - Maintenance injections scheduled for 08/03/23 - Call with any issues or questions  Weltha Cathy L. Paul Beasley, PharmD, BCIDP, AAHIVP, CPP Clinical Pharmacist Practitioner Infectious Diseases Clinical Pharmacist [facility]

## 2023-05-07 LAB — HIV-1 RNA QUANT-NO REFLEX-BLD
HIV 1 RNA Quant: NOT DETECTED Copies/mL
HIV-1 RNA Quant, Log: NOT DETECTED Log cps/mL

## 2023-05-30 ENCOUNTER — Telehealth: Payer: Self-pay

## 2023-05-30 NOTE — Telephone Encounter (Signed)
[facility] Patient Advocate Encounter  Patient's medication (Apretude) have been couriered to [facility] from [facility] and will be administered on the patient next office visit on 06/01/23.  Clearance Coots , CPhT Specialty Pharmacy Patient [facility] for Infectious Disease Phone: [phone] Fax:  [phone]

## 2023-06-01 ENCOUNTER — Ambulatory Visit: Payer: No Typology Code available for payment source | Admitting: Pharmacist

## 2023-06-01 ENCOUNTER — Other Ambulatory Visit: Payer: Self-pay

## 2023-06-01 DIAGNOSIS — Z79899 Other long term (current) drug therapy: Secondary | ICD-10-CM

## 2023-06-01 DIAGNOSIS — Z2981 Encounter for HIV pre-exposure prophylaxis: Secondary | ICD-10-CM | POA: Diagnosis not present

## 2023-06-01 MED ORDER — CABOTEGRAVIR ER 600 MG/3ML IM SUER
600.0000 mg | Freq: Once | INTRAMUSCULAR | Status: AC
Start: 2023-06-01 — End: 2023-06-01
  Administered 2023-06-01: 600 mg via INTRAMUSCULAR

## 2023-06-01 NOTE — Progress Notes (Addendum)
HPI: Paul Beasley is a 32 y.o. male who presents to the [facility] pharmacy clinic for Apretude administration and HIV PrEP follow up.  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   CABOTEGRAVIR ER (APRETUDE) 600 MG/3ML INJECTION    Inject 3 mLs (600 mg total) into the muscle every 2 (two) months.   EMTRICITABINE-TENOFOVIR AF (DESCOVY) 200-25 MG TABLET    Take 1 tablet by mouth daily.  Modified Medications   No medications on file  Discontinued Medications   No medications on file    Allergies: No Known Allergies  Labs: Lab Results  Component Value Date   HIV1RNAQUANT Not Detected 05/04/2023   HIV1RNAQUANT <20 NOT DETECTED 03/24/2020   HIV1RNAQUANT <20 NOT DETECTED 01/30/2018    RPR and STI Lab Results  Component Value Date   LABRPR NON-REACTIVE 04/19/2023   LABRPR NON-REACTIVE 12/10/2020   LABRPR NON-REACTIVE 08/24/2020   LABRPR NON-REACTIVE 06/01/2020   LABRPR NON-REACTIVE 12/03/2019    STI Results GC CT  04/19/2023  9:11 AM Negative    Negative    Negative  Negative    Negative    Negative   12/10/2020 11:42 AM Negative    Negative    Negative  Negative    Negative    Negative   11/03/2020  4:28 PM Negative  Negative   08/24/2020 11:48 AM Negative  Negative   06/01/2020 11:54 AM Negative     Negative  Negative    Negative   12/03/2019 12:00 PM Negative    Negative  Negative    Negative   06/20/2019 12:00 AM Negative  Negative   05/30/2019 12:00 AM Negative    Negative    Negative  Negative    Negative    Negative   11/30/2018 12:00 AM **POSITIVE**    Negative  Negative    Negative   04/03/2018 12:00 AM Negative    Negative    Negative  Negative    Negative    Negative   01/16/2018  5:00 PM  Negative   12/29/2017  4:37 PM  Negative     Hepatitis B Lab Results  Component Value Date   HEPBSAB REACTIVE (A) 02/27/2019   HEPBSAG NON-REACTIVE 04/03/2018   Hepatitis C No results found for: "HEPCAB", "HCVRNAPCRQN" Hepatitis A Lab Results  Component Value Date   HAV REACTIVE (A) 04/03/2018   Lipids: Lab Results  Component Value Date   CHOL 252 (H) 04/19/2023   TRIG 86 04/19/2023   HDL 47 04/19/2023   CHOLHDL 5.4 (H) 04/19/2023   LDLCALC 185 (H) 04/19/2023    TARGET DATE: The 18th of the month  Assessment: Paul Beasley presents today for his 2nd Apretude injection and to follow up for HIV PrEP. No issues with past injections. Denies any symptoms of  acute HIV. Last STI screening was on 04/19/23 and was negative. No known exposures to any STIs since last visit. He politely declines STI testing today.   Per [employer] guidelines, a rapid HIV test should be drawn prior to Apretude administration. Due to state shortage of rapid HIV tests, this is temporarily unable to be done. Per decision from [facility] physicians, we will proceed with Apretude administration at this time without a negative rapid HIV test beforehand. HIV RNA was collected today and is in process.  Administered cabotegravir 600mg /46mL in left upper outer quadrant of the gluteal muscle. Will see him back in 2 months for injection, labs, and HIV PrEP follow up.  Plan: - Apretude injection administered - HIV RNA today - Next injection, labs, and PrEP follow up appointment scheduled for 10/17 and 12/12 with  Paul Beasley - He is due for Tdap and should be counseled on at next appointment - Call with any issues or questions  Lennie Muckle, PharmD PGY1 Pharmacy Resident 06/01/2023 9:09 AM

## 2023-06-05 LAB — HIV-1 RNA QUANT-NO REFLEX-BLD
HIV 1 RNA Quant: NOT DETECTED {copies}/mL
HIV-1 RNA Quant, Log: NOT DETECTED {Log_copies}/mL

## 2023-07-13 ENCOUNTER — Ambulatory Visit: Payer: No Typology Code available for payment source | Admitting: Pharmacist

## 2023-07-27 ENCOUNTER — Telehealth: Payer: Self-pay

## 2023-07-27 NOTE — Telephone Encounter (Signed)
[facility] Patient Advocate Encounter  Patient's medications (APRETUDE) have been couriered to [facility] from [facility] and will be administered at the patients appointment on 08/03/23.  Kae Heller , CPhT Specialty Pharmacy Patient [facility] for Infectious Disease Phone: [phone] Fax:  [phone]

## 2023-08-02 NOTE — Progress Notes (Unsigned)
HPI: Paul Beasley is a 32 y.o. male who presents to the [facility] pharmacy clinic for Apretude administration and HIV PrEP follow up.  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   CABOTEGRAVIR ER (APRETUDE) 600 MG/3ML INJECTION    Inject 3 mLs (600 mg total) into the muscle every 2 (two) months.   EMTRICITABINE-TENOFOVIR AF (DESCOVY) 200-25 MG TABLET    Take 1 tablet by mouth daily.  Modified Medications   No medications on file  Discontinued Medications   No medications on file    Allergies: No Known Allergies  Labs: Lab Results  Component Value Date   HIV1RNAQUANT Not Detected 06/01/2023   HIV1RNAQUANT Not Detected 05/04/2023   HIV1RNAQUANT <20 NOT DETECTED 03/24/2020    RPR and STI Lab Results  Component Value Date   LABRPR NON-REACTIVE 04/19/2023   LABRPR NON-REACTIVE 12/10/2020   LABRPR NON-REACTIVE 08/24/2020   LABRPR NON-REACTIVE 06/01/2020   LABRPR NON-REACTIVE 12/03/2019    STI Results GC CT  04/19/2023  9:11 AM Negative    Negative    Negative  Negative    Negative    Negative   12/10/2020 11:42 AM Negative    Negative    Negative  Negative    Negative    Negative   11/03/2020  4:28 PM Negative  Negative   08/24/2020 11:48 AM Negative  Negative   06/01/2020 11:54 AM Negative     Negative  Negative    Negative   12/03/2019 12:00 PM Negative    Negative  Negative    Negative   06/20/2019 12:00 AM Negative  Negative   05/30/2019 12:00 AM Negative    Negative    Negative  Negative    Negative    Negative   11/30/2018 12:00 AM **POSITIVE**    Negative  Negative    Negative   04/03/2018 12:00 AM Negative    Negative    Negative  Negative    Negative    Negative   01/16/2018  5:00 PM  Negative   12/29/2017  4:37 PM  Negative     Hepatitis B Lab Results  Component Value Date   HEPBSAB REACTIVE (A) 02/27/2019   HEPBSAG NON-REACTIVE 04/03/2018   Hepatitis C No results found for: "HEPCAB", "HCVRNAPCRQN" Hepatitis A Lab Results  Component Value Date   HAV REACTIVE (A) 04/03/2018   Lipids: Lab Results  Component Value Date   CHOL 252 (H) 04/19/2023   TRIG 86 04/19/2023   HDL 47 04/19/2023   CHOLHDL 5.4 (H) 04/19/2023   LDLCALC 185 (H) 04/19/2023    TARGET DATE: The 18th  Assessment: Paul Beasley presents today for his Apretude injection and to follow up for HIV PrEP. No issues with past injections. Denies any symptoms of acute HIV. Last STI screening  was on 04/19/23 and was negative. No known exposures to any STIs since last visit and politely declines testing today. Agrees to receive the annual flu and 2024-2025 COVID vaccines today. Up-to-date on all other vaccines except tdap. Will offer that at December appointment.   Per [employer] guidelines, a rapid HIV test should be drawn prior to Apretude administration. Due to state shortage of rapid HIV tests, this is temporarily unable to be done. Per decision from [facility] physicians, we will proceed with Apretude administration at this time without a negative rapid HIV test beforehand. HIV RNA was collected today and is in process.  Administered cabotegravir 600mg /89mL in right upper outer quadrant of the gluteal muscle. Will see him back in 2 months for injection, labs, and HIV PrEP follow up. He needs a Thursday  or Friday appointment so will see Marchelle Folks from now on.  Plan: - Apretude injection administered - HIV RNA today - Administered the annual flu vaccine today - Administered the 2024-2025 COVID vaccine today - Next injection, labs, and PrEP follow up appointment scheduled for 09/29/23 - Call with any issues or questions  Hailynn Slovacek L. Maximillion Gill, PharmD, BCIDP, AAHIVP, CPP Clinical Pharmacist Practitioner Infectious Diseases Clinical Pharmacist [facility]

## 2023-08-03 ENCOUNTER — Ambulatory Visit: Payer: No Typology Code available for payment source | Admitting: Pharmacist

## 2023-08-03 ENCOUNTER — Other Ambulatory Visit: Payer: Self-pay

## 2023-08-03 DIAGNOSIS — Z23 Encounter for immunization: Secondary | ICD-10-CM | POA: Diagnosis not present

## 2023-08-03 DIAGNOSIS — Z79899 Other long term (current) drug therapy: Secondary | ICD-10-CM

## 2023-08-03 DIAGNOSIS — Z2981 Encounter for HIV pre-exposure prophylaxis: Secondary | ICD-10-CM | POA: Diagnosis not present

## 2023-08-03 DIAGNOSIS — Z113 Encounter for screening for infections with a predominantly sexual mode of transmission: Secondary | ICD-10-CM

## 2023-08-03 MED ORDER — CABOTEGRAVIR ER 600 MG/3ML IM SUER
600.0000 mg | Freq: Once | INTRAMUSCULAR | Status: AC
Start: 2023-08-03 — End: 2023-08-03
  Administered 2023-08-03: 600 mg via INTRAMUSCULAR

## 2023-08-05 LAB — HIV-1 RNA QUANT-NO REFLEX-BLD
HIV 1 RNA Quant: NOT DETECTED {copies}/mL
HIV-1 RNA Quant, Log: NOT DETECTED {Log}

## 2023-09-26 ENCOUNTER — Telehealth: Payer: Self-pay

## 2023-09-26 NOTE — Telephone Encounter (Signed)
[facility] Patient Advocate Encounter  Patient's medications APRETUDE have been couriered to [facility] from [facility] and will be administered at the patients appointment on 09/29/23.  Kae Heller, CPhT Specialty Pharmacy Patient [facility] for Infectious Disease Phone: [phone] Fax:  [phone]

## 2023-09-26 NOTE — Progress Notes (Signed)
HPI: Paul Beasley is a 32 y.o. male who presents to the [facility] pharmacy clinic for Apretude administration and HIV PrEP follow up.  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   CABOTEGRAVIR ER (APRETUDE) 600 MG/3ML INJECTION    Inject 3 mLs (600 mg total) into the muscle every 2 (two) months.   EMTRICITABINE-TENOFOVIR AF (DESCOVY) 200-25 MG TABLET    Take 1 tablet by mouth daily.  Modified Medications   No medications on file  Discontinued Medications   No medications on file    Allergies: No Known Allergies  Past Medical History: Past Medical History:  Diagnosis Date   Migraine    Testicular torsion     Social History: Social History   Socioeconomic History   Marital status: Single    Spouse name: Not on file   Number of children: Not on file   Years of education: Not on file   Highest education level: Not on file  Occupational History   Occupation: Chief Technology Officer  Tobacco Use   Smoking status: Never   Smokeless tobacco: Never  Substance and Sexual Activity   Alcohol use: Yes    Alcohol/week: 0.0 standard drinks of alcohol    Comment: occ   Drug use: No   Sexual activity: Yes    Partners: Female, Male    Comment: offered condoms  Other Topics Concern   Not on file  Social History Narrative   Not on file   Social  Determinants of Health   Financial Resource Strain: Not on file  Food Insecurity: Not on file  Transportation Needs: Not on file  Physical Activity: Not on file  Stress: Not on file  Social Connections: Unknown (03/01/2022)   Received from [facility], [facility]   Social Network    Social Network: Not on file    Labs: Lab Results  Component Value Date   HIV1RNAQUANT Not Detected 08/03/2023   HIV1RNAQUANT Not Detected 06/01/2023   HIV1RNAQUANT Not Detected 05/04/2023    RPR and STI Lab Results  Component Value Date   LABRPR NON-REACTIVE 04/19/2023   LABRPR NON-REACTIVE 12/10/2020   LABRPR NON-REACTIVE 08/24/2020   LABRPR NON-REACTIVE 06/01/2020   LABRPR NON-REACTIVE 12/03/2019    STI Results GC CT  04/19/2023  9:11 AM Negative    Negative    Negative  Negative    Negative    Negative   12/10/2020 11:42 AM Negative    Negative    Negative  Negative    Negative    Negative   11/03/2020  4:28 PM Negative  Negative   08/24/2020 11:48 AM Negative  Negative   06/01/2020 11:54 AM Negative    Negative  Negative    Negative   12/03/2019 12:00 PM Negative    Negative  Negative    Negative   06/20/2019 12:00 AM  Negative  Negative   05/30/2019 12:00 AM Negative    Negative    Negative  Negative    Negative    Negative   11/30/2018 12:00 AM **POSITIVE**    Negative  Negative    Negative   04/03/2018 12:00 AM Negative    Negative    Negative  Negative    Negative    Negative   01/16/2018  5:00 PM  Negative   12/29/2017  4:37 PM  Negative     Hepatitis B Lab Results  Component Value Date   HEPBSAB REACTIVE (A) 02/27/2019   HEPBSAG NON-REACTIVE 04/03/2018   Hepatitis C No results found for: "HEPCAB", "HCVRNAPCRQN" Hepatitis A Lab Results  Component Value Date   HAV REACTIVE (A) 04/03/2018   Lipids: Lab Results  Component Value Date   CHOL 252 (H) 04/19/2023   TRIG 86 04/19/2023   HDL 47 04/19/2023   CHOLHDL 5.4 (H) 04/19/2023   LDLCALC 185 (H)  04/19/2023    TARGET DATE: The 18th of the month  Assessment: Jacks presents today for their Apretude injection and to follow up for HIV PrEP. No issues with past injections.  Screened patient for acute HIV symptoms such as fatigue, muscle aches, rash, sore throat, lymphadenopathy, headache, night sweats, nausea/vomiting/diarrhea, and fever. Patient denies any symptoms.   Per [employer] guidelines, a rapid HIV test should be drawn prior to Apretude administration. Due to state shortage of rapid HIV tests, this is temporarily unable to be done. Per decision from [facility] physicians, we will proceed with Apretude administration at this time without a negative rapid HIV test beforehand. HIV RNA was collected today and is in process.  Administered cabotegravir 600mg /67mL in right upper outer quadrant of the gluteal muscle. Will make follow up appointments for maintenance injections every 2 months.   No known exposures to any STIs and no signs or symptoms of any STIs today. Last STI screening was in July and was negative. No new partners since last injection; politely declines STI testing today.   Due for Tdap booster which was last given in 2010. He cannot recall receiving it since then, and we have no records available indicating he received it.   Plan: - Administer Apretude 600 mg x 1  - Maintenance injections scheduled for 2/13 and 4/11 with me  - Check HIV RNA - Administer Tdap vaccine - Call with any issues or questions  Margarite Gouge, PharmD, CPP, BCIDP, AAHIVP Clinical Pharmacist Practitioner Infectious Diseases Clinical Pharmacist [facility]

## 2023-09-28 ENCOUNTER — Ambulatory Visit: Payer: No Typology Code available for payment source | Admitting: Pharmacist

## 2023-09-29 ENCOUNTER — Other Ambulatory Visit: Payer: Self-pay

## 2023-09-29 ENCOUNTER — Ambulatory Visit (INDEPENDENT_AMBULATORY_CARE_PROVIDER_SITE_OTHER): Payer: No Typology Code available for payment source | Admitting: Pharmacist

## 2023-09-29 DIAGNOSIS — Z113 Encounter for screening for infections with a predominantly sexual mode of transmission: Secondary | ICD-10-CM

## 2023-09-29 DIAGNOSIS — Z2981 Encounter for HIV pre-exposure prophylaxis: Secondary | ICD-10-CM | POA: Diagnosis not present

## 2023-09-29 DIAGNOSIS — Z23 Encounter for immunization: Secondary | ICD-10-CM | POA: Diagnosis not present

## 2023-09-29 DIAGNOSIS — Z79899 Other long term (current) drug therapy: Secondary | ICD-10-CM

## 2023-09-29 MED ORDER — CABOTEGRAVIR ER 600 MG/3ML IM SUER
600.0000 mg | Freq: Once | INTRAMUSCULAR | Status: AC
  Administered 2023-09-29: 600 mg via INTRAMUSCULAR

## 2023-10-01 LAB — HIV-1 RNA QUANT-NO REFLEX-BLD
HIV 1 RNA Quant: NOT DETECTED {copies}/mL
HIV-1 RNA Quant, Log: NOT DETECTED {Log}

## 2023-11-29 ENCOUNTER — Telehealth: Payer: Self-pay

## 2023-11-29 NOTE — Progress Notes (Unsigned)
HPI: Paul Beasley is a 33 y.o. male who presents to the [facility] pharmacy clinic for Apretude administration and HIV PrEP follow up.  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   CABOTEGRAVIR ER (APRETUDE) 600 MG/3ML INJECTION    Inject 3 mLs (600 mg total) into the muscle every 2 (two) months.   EMTRICITABINE-TENOFOVIR AF (DESCOVY) 200-25 MG TABLET    Take 1 tablet by mouth daily.  Modified Medications   No medications on file  Discontinued Medications   No medications on file    Allergies: No Known Allergies  Past Medical History: Past Medical History:  Diagnosis Date   Migraine    Testicular torsion     Social History: Social History   Socioeconomic History   Marital status: Single    Spouse name: Not on file   Number of children: Not on file   Years of education: Not on file   Highest education level: Not on file  Occupational History   Occupation: Chief Technology Officer  Tobacco Use   Smoking status: Never   Smokeless tobacco: Never  Substance and Sexual Activity   Alcohol use: Yes    Alcohol/week: 0.0 standard drinks of alcohol    Comment: occ   Drug use: No   Sexual activity: Yes    Partners: Female, Male    Comment: offered condoms  Other Topics Concern   Not on file  Social History Narrative   Not on file   Social Drivers  of Health   Financial Resource Strain: Not on file  Food Insecurity: Not on file  Transportation Needs: Not on file  Physical Activity: Not on file  Stress: Not on file  Social Connections: Unknown (03/01/2022)   Received from [facility], [facility]   Social Network    Social Network: Not on file    Labs: Lab Results  Component Value Date   HIV1RNAQUANT Not Detected 09/29/2023   HIV1RNAQUANT Not Detected 08/03/2023   HIV1RNAQUANT Not Detected 06/01/2023    RPR and STI Lab Results  Component Value Date   LABRPR NON-REACTIVE 04/19/2023   LABRPR NON-REACTIVE 12/10/2020   LABRPR NON-REACTIVE 08/24/2020   LABRPR NON-REACTIVE 06/01/2020   LABRPR NON-REACTIVE 12/03/2019    STI Results GC CT  04/19/2023  9:11 AM Negative    Negative    Negative  Negative    Negative    Negative   12/10/2020 11:42 AM Negative    Negative    Negative  Negative    Negative    Negative   11/03/2020  4:28 PM Negative  Negative   08/24/2020 11:48 AM Negative  Negative   06/01/2020 11:54 AM Negative    Negative  Negative    Negative   12/03/2019 12:00 PM Negative    Negative  Negative    Negative   06/20/2019 12:00 AM  Negative  Negative   05/30/2019 12:00 AM Negative    Negative    Negative  Negative    Negative    Negative   11/30/2018 12:00 AM **POSITIVE**    Negative  Negative    Negative   04/03/2018 12:00 AM Negative    Negative    Negative  Negative    Negative    Negative   01/16/2018  5:00 PM  Negative   12/29/2017  4:37 PM  Negative     Hepatitis B Lab Results  Component Value Date   HEPBSAB REACTIVE (A) 02/27/2019   HEPBSAG NON-REACTIVE 04/03/2018   Hepatitis C No results found for: "HEPCAB", "HCVRNAPCRQN" Hepatitis A Lab Results  Component Value Date   HAV REACTIVE (A) 04/03/2018   Lipids: Lab Results  Component Value Date   CHOL 252 (H) 04/19/2023   TRIG 86 04/19/2023   HDL 47 04/19/2023   CHOLHDL 5.4 (H) 04/19/2023   LDLCALC 185 (H) 04/19/2023     TARGET DATE: The 18th of the month  Assessment: Nahshon presents today for their Apretude injection and to follow up for HIV PrEP. No issues with past injections.  Screened patient for acute HIV symptoms such as fatigue, muscle aches, rash, sore throat, lymphadenopathy, headache, night sweats, nausea/vomiting/diarrhea, and fever. Patient denies any symptoms.   Per [employer] guidelines, a rapid HIV test should be drawn prior to Apretude administration. Due to state shortage of rapid HIV tests, this is temporarily unable to be done. Per decision from [facility] physicians, we will proceed with Apretude administration at this time without a negative rapid HIV test beforehand. HIV RNA was collected today and is in process.  Administered cabotegravir 600mg /87mL in left upper outer quadrant of the gluteal muscle. Will make follow up appointments for maintenance injections every 2 months.   No known exposures to any STIs and no signs or symptoms of any STIs today. Last STI screening was July 2024 and was negative. No new partners since last injection and politely declines STI testing today.   Due for MPX today which he declines.   Plan: - Administer Apretude 600 mg x 1  - Maintenance injections scheduled for 4/11 with me  - Check HIV RNA - Call with any issues or questions  Margarite Gouge, PharmD, CPP, BCIDP, AAHIVP Clinical Pharmacist Practitioner Infectious Diseases Clinical Pharmacist [facility]

## 2023-11-29 NOTE — Telephone Encounter (Signed)
[facility] Patient Advocate Encounter  Patient's medications Apretude have been couriered to [facility] from [facility] and will be administered at the patients appointment on 11/30/23.  Clearance Coots, CPhT Specialty Pharmacy Patient [facility] for Infectious Disease Phone: [phone] Fax:  [phone]

## 2023-11-30 ENCOUNTER — Other Ambulatory Visit: Payer: Self-pay

## 2023-11-30 ENCOUNTER — Ambulatory Visit (INDEPENDENT_AMBULATORY_CARE_PROVIDER_SITE_OTHER): Payer: Commercial Managed Care - PPO | Admitting: Pharmacist

## 2023-11-30 DIAGNOSIS — Z79899 Other long term (current) drug therapy: Secondary | ICD-10-CM

## 2023-11-30 DIAGNOSIS — Z2981 Encounter for HIV pre-exposure prophylaxis: Secondary | ICD-10-CM

## 2023-11-30 MED ORDER — CABOTEGRAVIR ER 600 MG/3ML IM SUER
600.0000 mg | Freq: Once | INTRAMUSCULAR | Status: AC
Start: 2023-11-30 — End: 2023-11-30
  Administered 2023-11-30: 600 mg via INTRAMUSCULAR

## 2023-12-03 LAB — HIV-1 RNA QUANT-NO REFLEX-BLD
HIV 1 RNA Quant: NOT DETECTED {copies}/mL
HIV-1 RNA Quant, Log: NOT DETECTED {Log}

## 2024-01-23 ENCOUNTER — Telehealth: Payer: Self-pay

## 2024-01-23 NOTE — Telephone Encounter (Signed)
 [facility] Patient Advocate Encounter  Patient's medications APRETUDE have been couriered to [facility] from [facility] and will be administered at the patients appointment on 01/26/24.  Kae Heller, CPhT Specialty Pharmacy Patient [facility] for Infectious Disease Phone: [phone] Fax:  [phone]

## 2024-01-26 ENCOUNTER — Other Ambulatory Visit: Payer: Self-pay

## 2024-01-26 ENCOUNTER — Ambulatory Visit: Payer: Self-pay | Admitting: Pharmacist

## 2024-01-26 DIAGNOSIS — Z2981 Encounter for HIV pre-exposure prophylaxis: Secondary | ICD-10-CM | POA: Diagnosis not present

## 2024-01-26 DIAGNOSIS — Z79899 Other long term (current) drug therapy: Secondary | ICD-10-CM

## 2024-01-26 DIAGNOSIS — Z113 Encounter for screening for infections with a predominantly sexual mode of transmission: Secondary | ICD-10-CM

## 2024-01-26 MED ORDER — CABOTEGRAVIR ER 600 MG/3ML IM SUER
600.0000 mg | Freq: Once | INTRAMUSCULAR | Status: AC
  Administered 2024-01-26: 600 mg via INTRAMUSCULAR

## 2024-01-26 MED ORDER — DOXYCYCLINE HYCLATE 100 MG PO TABS: ORAL_TABLET | ORAL | 0 refills | Status: DC

## 2024-01-26 NOTE — Progress Notes (Signed)
 HPI: Paul Beasley is a 33 y.o. male who presents to the [facility] pharmacy clinic for Apretude administration and HIV PrEP follow up.  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   CABOTEGRAVIR ER (APRETUDE) 600 MG/3ML INJECTION    Inject 3 mLs (600 mg total) into the muscle every 2 (two) months.   EMTRICITABINE-TENOFOVIR AF (DESCOVY) 200-25 MG TABLET    Take 1 tablet by mouth daily.  Modified Medications   No medications on file  Discontinued Medications   No medications on file    Allergies: No Known Allergies  Labs: Lab Results  Component Value Date   HIV1RNAQUANT Not Detected 11/30/2023   HIV1RNAQUANT Not Detected 09/29/2023   HIV1RNAQUANT Not Detected 08/03/2023    RPR and STI Lab Results  Component Value Date   LABRPR NON-REACTIVE 04/19/2023   LABRPR NON-REACTIVE 12/10/2020   LABRPR NON-REACTIVE 08/24/2020   LABRPR NON-REACTIVE 06/01/2020   LABRPR NON-REACTIVE 12/03/2019    STI Results GC CT  04/19/2023  9:11 AM Negative    Negative    Negative  Negative    Negative    Negative   12/10/2020 11:42 AM Negative    Negative    Negative  Negative    Negative    Negative   11/03/2020  4:28 PM Negative  Negative   08/24/2020 11:48 AM Negative  Negative   06/01/2020 11:54 AM Negative    Negative   Negative    Negative   12/03/2019 12:00 PM Negative    Negative  Negative    Negative   06/20/2019 12:00 AM Negative  Negative   05/30/2019 12:00 AM Negative    Negative    Negative  Negative    Negative    Negative   11/30/2018 12:00 AM **POSITIVE**    Negative  Negative    Negative   04/03/2018 12:00 AM Negative    Negative    Negative  Negative    Negative    Negative   01/16/2018  5:00 PM  Negative   12/29/2017  4:37 PM  Negative     Hepatitis B Lab Results  Component Value Date   HEPBSAB REACTIVE (A) 02/27/2019   HEPBSAG NON-REACTIVE 04/03/2018   Hepatitis C No results found for: "HEPCAB", "HCVRNAPCRQN" Hepatitis A Lab Results  Component Value Date   HAV REACTIVE (A) 04/03/2018   Lipids: Lab Results  Component Value Date   CHOL 252 (H) 04/19/2023   TRIG 86 04/19/2023   HDL 47 04/19/2023   CHOLHDL 5.4 (H) 04/19/2023   LDLCALC 185 (H) 04/19/2023    TARGET DATE: 18th  Assessment: Paul Beasley presents today for their Apretude injection and to follow up for HIV PrEP. No issues with past injections. Denies any symptoms of acute HIV. Last STI screening was on  04/19/2023 and was negative. No known exposures to any STIs since last visit. Agrees to STI testing today.    Patient expressed interest in DoxyPEP. Counseled on administration timeline, side effects, and purpose. Will prescribe today.  Per [employer] guidelines, a rapid HIV test should be drawn prior to Apretude administration. Due to state shortage of rapid HIV tests, this is temporarily unable to be done. Per decision from [facility] physicians, we will proceed with Apretude administration at this time without a negative rapid HIV test beforehand. HIV RNA was collected today and is in process.  Administered cabotegravir 600mg /70mL in RIGHT upper outer quadrant of the gluteal muscle. Will see Ketih back in 2 months for injection, labs, and HIV PrEP follow up.  Plan: - Apretude injection administered - HIV RNA  today - [facility] - Urine/rectal/oral cytologies today - Next injection, labs, and PrEP follow up appointment scheduled for June 20th - Call with any issues or questions  Haze Boyden PharmD Candidate

## 2024-01-30 LAB — GC/CHLAMYDIA PROBE, AMP (THROAT)
Chlamydia trachomatis RNA: NOT DETECTED
Neisseria gonorrhoeae RNA: NOT DETECTED

## 2024-01-30 LAB — HIV-1 RNA QUANT-NO REFLEX-BLD
HIV 1 RNA Quant: NOT DETECTED {copies}/mL
HIV-1 RNA Quant, Log: NOT DETECTED {Log_copies}/mL

## 2024-01-30 LAB — CT/NG RNA, TMA RECTAL
Chlamydia Trachomatis RNA: NOT DETECTED
Neisseria Gonorrhoeae RNA: NOT DETECTED

## 2024-01-30 LAB — C. TRACHOMATIS/N. GONORRHOEAE RNA
C. trachomatis RNA, TMA: NOT DETECTED
N. gonorrhoeae RNA, TMA: NOT DETECTED

## 2024-01-30 LAB — RPR: RPR Ser Ql: NONREACTIVE

## 2024-02-06 ENCOUNTER — Encounter: Payer: Self-pay | Admitting: Pharmacist

## 2024-02-07 ENCOUNTER — Other Ambulatory Visit: Payer: Self-pay | Admitting: Pharmacist

## 2024-02-07 DIAGNOSIS — Z113 Encounter for screening for infections with a predominantly sexual mode of transmission: Secondary | ICD-10-CM

## 2024-02-07 MED ORDER — DOXYCYCLINE HYCLATE 100 MG PO TABS: ORAL_TABLET | ORAL | 0 refills | Status: DC

## 2024-03-15 ENCOUNTER — Encounter: Payer: Self-pay | Admitting: Pharmacist

## 2024-03-19 ENCOUNTER — Other Ambulatory Visit: Payer: Self-pay | Admitting: Pharmacist

## 2024-03-19 DIAGNOSIS — Z113 Encounter for screening for infections with a predominantly sexual mode of transmission: Secondary | ICD-10-CM

## 2024-03-19 MED ORDER — DOXYCYCLINE HYCLATE 100 MG PO TABS: ORAL_TABLET | ORAL | 2 refills | Status: AC

## 2024-04-02 ENCOUNTER — Telehealth: Payer: Self-pay

## 2024-04-02 NOTE — Telephone Encounter (Signed)
 [facility] Patient Advocate Encounter  Patient's medications Apretude  have been couriered to [facility] from [facility] and will be administered at the patients appointment on 04/05/24.  Roylene Corn, CPhT Specialty Pharmacy Patient [facility] for Infectious Disease Phone: [phone] Fax:  [phone]

## 2024-04-04 NOTE — Progress Notes (Signed)
 HPI: Paul Beasley is a 33 y.o. male who presents to the [facility] pharmacy clinic for Apretude  administration and HIV PrEP follow up.  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   CABOTEGRAVIR  ER (APRETUDE ) 600 MG/3ML INJECTION    Inject 3 mLs (600 mg total) into the muscle every 2 (two) months.   DOXYCYCLINE  (VIBRA -TABS) 100 MG TABLET    Take 2 tablets (200 mg) by mouth within 24-72 hours after condomless sex. If you have sex again within 24 hours of taking doxycycline , take another dose 24 hours after your last dose.  Modified Medications   No medications on file  Discontinued Medications   No medications on file    Allergies: No Known Allergies  Labs: Lab Results  Component Value Date   HIV1RNAQUANT NOT DETECTED 01/26/2024   HIV1RNAQUANT Not Detected 11/30/2023   HIV1RNAQUANT Not Detected 09/29/2023    RPR and STI Lab Results  Component Value Date   LABRPR NON-REACTIVE 01/26/2024   LABRPR NON-REACTIVE 04/19/2023   LABRPR NON-REACTIVE 12/10/2020   LABRPR NON-REACTIVE 08/24/2020   LABRPR NON-REACTIVE 06/01/2020    STI Results GC CT  04/19/2023  9:11 AM Negative    Negative    Negative  Negative    Negative    Negative   12/10/2020 11:42 AM Negative    Negative    Negative  Negative    Negative     Negative   11/03/2020  4:28 PM Negative  Negative   08/24/2020 11:48 AM Negative  Negative   06/01/2020 11:54 AM Negative    Negative  Negative    Negative   12/03/2019 12:00 PM Negative    Negative  Negative    Negative   06/20/2019 12:00 AM Negative  Negative   05/30/2019 12:00 AM Negative    Negative    Negative  Negative    Negative    Negative   11/30/2018 12:00 AM **POSITIVE**    Negative  Negative    Negative   04/03/2018 12:00 AM Negative    Negative    Negative  Negative    Negative    Negative   01/16/2018  5:00 PM  Negative   12/29/2017  4:37 PM  Negative     Hepatitis B Lab Results  Component Value Date   HEPBSAB REACTIVE (A) 02/27/2019   HEPBSAG NON-REACTIVE 04/03/2018   Hepatitis C No results found for: HEPCAB, HCVRNAPCRQN Hepatitis A Lab Results  Component Value Date   HAV REACTIVE (A) 04/03/2018   Lipids: Lab Results  Component Value Date   CHOL 252 (H) 04/19/2023   TRIG 86 04/19/2023   HDL 47 04/19/2023   CHOLHDL 5.4 (H) 04/19/2023   LDLCALC 185 (H) 04/19/2023    TARGET DATE: 18th  Assessment: Paul Beasley presents today for  his Apretude  injection and to follow up for HIV PrEP. No issues with past injections. Denies any symptoms of acute HIV. Last STI screening was on 01/26/2024 and was negative. No known exposures to any STIs since last visit. He agrees to STI testing today. He had not complaints regarding Doxycycline  for STI prevention.   Per [employer] guidelines, a rapid HIV test should be drawn prior to Apretude  administration. Due to state shortage of rapid HIV tests, this is temporarily unable to be done. Per decision from [facility] physicians, we will proceed with Apretude  administration at this time without a negative rapid HIV test beforehand. HIV RNA was collected today and is in process.  Administered cabotegravir  600mg /69mL in LEFT upper outer quadrant of the gluteal muscle. Will see him back in 2 months for injection, labs, and HIV  PrEP follow up.  Plan: - Apretude  injection administered - HIV RNA, RPR, oral/rectal/urine cytologies collected today - Next injection, labs, and PrEP follow up appointment scheduled for 08/15 - Call with any issues or questions  Tolu Yassmin Binegar, PharmD [facility] PGY-1

## 2024-04-05 ENCOUNTER — Other Ambulatory Visit: Payer: Self-pay

## 2024-04-05 ENCOUNTER — Ambulatory Visit (INDEPENDENT_AMBULATORY_CARE_PROVIDER_SITE_OTHER): Admitting: Pharmacist

## 2024-04-05 DIAGNOSIS — Z2981 Encounter for HIV pre-exposure prophylaxis: Secondary | ICD-10-CM

## 2024-04-05 DIAGNOSIS — Z79899 Other long term (current) drug therapy: Secondary | ICD-10-CM

## 2024-04-05 DIAGNOSIS — Z113 Encounter for screening for infections with a predominantly sexual mode of transmission: Secondary | ICD-10-CM

## 2024-04-05 MED ORDER — CABOTEGRAVIR ER 600 MG/3ML IM SUER
600.0000 mg | Freq: Once | INTRAMUSCULAR | Status: AC
  Administered 2024-04-05: 600 mg via INTRAMUSCULAR

## 2024-04-06 LAB — C. TRACHOMATIS/N. GONORRHOEAE RNA
C. trachomatis RNA, TMA: NOT DETECTED
N. gonorrhoeae RNA, TMA: NOT DETECTED

## 2024-04-06 LAB — CT/NG RNA, TMA RECTAL
Chlamydia Trachomatis RNA: NOT DETECTED
Neisseria Gonorrhoeae RNA: NOT DETECTED

## 2024-04-06 LAB — GC/CHLAMYDIA PROBE, AMP (THROAT)
Chlamydia trachomatis RNA: NOT DETECTED
Neisseria gonorrhoeae RNA: NOT DETECTED

## 2024-04-07 LAB — RPR: RPR Ser Ql: NONREACTIVE

## 2024-04-07 LAB — HIV-1 RNA QUANT-NO REFLEX-BLD
HIV 1 RNA Quant: NOT DETECTED {copies}/mL
HIV-1 RNA Quant, Log: NOT DETECTED {Log_copies}/mL

## 2024-05-10 ENCOUNTER — Other Ambulatory Visit: Payer: Self-pay | Admitting: Pharmacist

## 2024-05-13 ENCOUNTER — Other Ambulatory Visit: Payer: Self-pay | Admitting: Pharmacist

## 2024-05-13 DIAGNOSIS — Z79899 Other long term (current) drug therapy: Secondary | ICD-10-CM

## 2024-05-13 MED ORDER — APRETUDE 600 MG/3ML IM SUER: 600.0000 mg | INTRAMUSCULAR | 5 refills | Status: AC

## 2024-05-27 NOTE — Progress Notes (Signed)
 HPI: Paul Beasley is a 33 y.o. male who presents to the [facility] pharmacy clinic for Apretude  administration and HIV PrEP follow up.  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   CABOTEGRAVIR  ER (APRETUDE ) 600 MG/3ML INJECTION    Inject 3 mLs (600 mg total) into the muscle every 2 (two) months.   DOXYCYCLINE  (VIBRA -TABS) 100 MG TABLET    Take 2 tablets (200 mg) by mouth within 24-72 hours after condomless sex. If you have sex again within 24 hours of taking doxycycline , take another dose 24 hours after your last dose.  Modified Medications   No medications on file  Discontinued Medications   No medications on file    Allergies: No Known Allergies  Past Medical History: Past Medical History:  Diagnosis Date   Migraine    Testicular torsion     Social History: Social History   Socioeconomic History   Marital status: Single    Spouse name: Not on file   Number of children: Not on file   Years of education: Not on file   Highest education level: Not on file  Occupational History   Occupation: Chief Technology Officer  Tobacco Use   Smoking status: Never   Smokeless tobacco: Never  Substance and Sexual Activity   Alcohol use: Yes    Alcohol/week: 0.0 standard drinks of alcohol    Comment: occ   Drug use: No   Sexual activity: Yes    Partners:  Female, Male    Comment: offered condoms  Other Topics Concern   Not on file  Social History Narrative   Not on file   Social Drivers of Health   Financial Resource Strain: Not on file  Food Insecurity: Not on file  Transportation Needs: Not on file  Physical Activity: Not on file  Stress: Not on file  Social Connections: Unknown (03/01/2022)   Received from [facility]   Social Network    Social Network: Not on file    Labs: Lab Results  Component Value Date   HIV1RNAQUANT NOT DETECTED 04/05/2024   HIV1RNAQUANT NOT DETECTED 01/26/2024   HIV1RNAQUANT Not Detected 11/30/2023    RPR and STI Lab Results  Component Value Date   LABRPR NON-REACTIVE 04/05/2024   LABRPR NON-REACTIVE 01/26/2024   LABRPR NON-REACTIVE 04/19/2023   LABRPR NON-REACTIVE 12/10/2020   LABRPR NON-REACTIVE 08/24/2020    STI Results GC CT  04/19/2023  9:11 AM Negative    Negative    Negative  Negative    Negative    Negative   12/10/2020 11:42 AM Negative    Negative    Negative  Negative    Negative    Negative   11/03/2020  4:28 PM Negative  Negative   08/24/2020 11:48 AM Negative  Negative   06/01/2020 11:54 AM Negative    Negative  Negative  Negative   12/03/2019 12:00 PM Negative    Negative  Negative    Negative   06/20/2019 12:00 AM Negative  Negative   05/30/2019 12:00 AM Negative    Negative    Negative  Negative    Negative    Negative   11/30/2018 12:00 AM **POSITIVE**    Negative  Negative    Negative   04/03/2018 12:00 AM Negative    Negative    Negative  Negative    Negative    Negative   01/16/2018  5:00 PM  Negative   12/29/2017  4:37 PM  Negative     Hepatitis B Lab Results  Component Value Date   HEPBSAB REACTIVE (A) 02/27/2019   HEPBSAG NON-REACTIVE 04/03/2018   Hepatitis C No results found for: HEPCAB, HCVRNAPCRQN Hepatitis A Lab Results  Component Value Date   HAV REACTIVE (A) 04/03/2018   Lipids: Lab Results  Component Value Date    CHOL 252 (H) 04/19/2023   TRIG 86 04/19/2023   HDL 47 04/19/2023   CHOLHDL 5.4 (H) 04/19/2023   LDLCALC 185 (H) 04/19/2023    TARGET DATE: The 18th of the month  Assessment: Nixxon presents today for their Apretude  injection and to follow up for HIV PrEP. No issues with past injections.  Screened patient for acute HIV symptoms such as fatigue, muscle aches, rash, sore throat, lymphadenopathy, headache, night sweats, nausea/vomiting/diarrhea, and fever. Patient denies any symptoms.   Per [employer] guidelines, a rapid HIV test should be drawn prior to Apretude  administration. Due to state shortage of rapid HIV tests, this is temporarily unable to be done. Per decision from [facility] physicians, we will proceed with Apretude  administration at this time without a negative rapid HIV test beforehand. HIV RNA was collected today and is in process.  Administered cabotegravir  600mg /24mL in right upper outer quadrant of the gluteal muscle. Will make follow up appointments for maintenance injections every 2 months.   No known exposures to any STIs and no signs or symptoms of any STIs today. Last STI screening was in June and was negative. Has been active with 2-3 new partners since last injection; will check full STI testing today.   Plan: - Administer Apretude  600 mg x 1  - Maintenance injections scheduled for 10/17 and 12/12 with me  - Check HIV RNA, RPR, and urine/oral/rectal cytologies  - Call with any issues or questions  Alan Geralds, PharmD, CPP, BCIDP, AAHIVP Clinical Pharmacist Practitioner Infectious Diseases Clinical Pharmacist [facility]

## 2024-05-28 ENCOUNTER — Telehealth: Payer: Self-pay

## 2024-05-28 NOTE — Telephone Encounter (Signed)
 [facility] Patient Advocate Encounter  Patient's medications Apretude  have been couriered to [facility] from [facility] and will be administered at the patients appointment on 05/31/24.  Arland Hutchinson, CPhT Specialty Pharmacy Patient [facility] Phone: [phone] Fax:  [phone]

## 2024-05-31 ENCOUNTER — Other Ambulatory Visit: Payer: Self-pay

## 2024-05-31 ENCOUNTER — Ambulatory Visit: Admitting: Pharmacist

## 2024-05-31 DIAGNOSIS — Z79899 Other long term (current) drug therapy: Secondary | ICD-10-CM

## 2024-05-31 DIAGNOSIS — Z2981 Encounter for HIV pre-exposure prophylaxis: Secondary | ICD-10-CM

## 2024-05-31 DIAGNOSIS — Z113 Encounter for screening for infections with a predominantly sexual mode of transmission: Secondary | ICD-10-CM

## 2024-05-31 MED ORDER — CABOTEGRAVIR ER 600 MG/3ML IM SUER
600.0000 mg | Freq: Once | INTRAMUSCULAR | Status: AC
  Administered 2024-05-31: 600 mg via INTRAMUSCULAR

## 2024-06-01 LAB — GC/CHLAMYDIA PROBE, AMP (THROAT)
Chlamydia trachomatis RNA: NOT DETECTED
Neisseria gonorrhoeae RNA: NOT DETECTED

## 2024-06-01 LAB — CT/NG RNA, TMA RECTAL
Chlamydia Trachomatis RNA: NOT DETECTED
Neisseria Gonorrhoeae RNA: NOT DETECTED

## 2024-06-01 LAB — C. TRACHOMATIS/N. GONORRHOEAE RNA
C. trachomatis RNA, TMA: NOT DETECTED
N. gonorrhoeae RNA, TMA: NOT DETECTED

## 2024-06-02 LAB — HIV-1 RNA QUANT-NO REFLEX-BLD
HIV 1 RNA Quant: NOT DETECTED {copies}/mL
HIV-1 RNA Quant, Log: NOT DETECTED {Log_copies}/mL

## 2024-06-02 LAB — RPR: RPR Ser Ql: NONREACTIVE

## 2024-07-11 ENCOUNTER — Ambulatory Visit
Admission: RE | Admit: 2024-07-11 | Discharge: 2024-07-11 | Disposition: A | Discharge status: Home / Self Care | Attending: Physician Assistant | Admitting: Physician Assistant

## 2024-07-11 ENCOUNTER — Other Ambulatory Visit: Payer: Self-pay

## 2024-07-11 VITALS — BP 129/80 | HR 77 | Temp 98.2°F | Resp 16 | Ht 70.0 in | Wt 165.0 lb

## 2024-07-11 DIAGNOSIS — N5089 Other specified disorders of the male genital organs: Secondary | ICD-10-CM | POA: Insufficient documentation

## 2024-07-11 DIAGNOSIS — R21 Rash and other nonspecific skin eruption: Secondary | ICD-10-CM | POA: Diagnosis present

## 2024-07-11 MED ORDER — NYSTATIN 100000 UNIT/GM EX CREA: TOPICAL_CREAM | CUTANEOUS | 0 refills | Status: AC

## 2024-07-11 MED ORDER — TRIAMCINOLONE ACETONIDE 0.1 % EX CREA: 1.0000 | TOPICAL_CREAM | Freq: Two times a day (BID) | CUTANEOUS | 0 refills | Status: AC

## 2024-07-11 NOTE — Discharge Instructions (Addendum)
 VISIT SUMMARY:  You came in today with concerns about itchy bumps near your groin and rashes on other parts of your body. You were worried about the possibility of sexually transmitted diseases and mentioned that you have been using a new soap and not showering immediately after workouts, which might be contributing to your symptoms.  YOUR PLAN:  -GENITAL PRURITUS AND PRURITIC PAPULAR ERUPTION OF TRUNK/UPPER BODY: You have itchy bumps in your groin area and non-itchy bumps on your trunk and upper body, likely due to a reaction to your new soap. Yeast infections can also occur in moist areas and cause itching. We have prescribed nystatin  cream for your groin area to be applied twice daily and a steroid cream for the bumps on your trunk and upper body, avoiding the face, armpits, and groin.  -FORDYCE SPOTS: The bumps in your genital area are consistent with Fordyce spots, which are harmless and not related to any disease. We have swabbed them for HSV for rule out and will keep up updated on those results once available.   INSTRUCTIONS:  Please follow up with STD testing as planned in a week or two. Apply the nystatin  cream to the groin area twice daily and the steroid cream to the bumps on your trunk and upper body, avoiding the face, armpits, and groin. Consider stopping the use of the new soap to see if your symptoms improve.

## 2024-07-11 NOTE — ED Provider Notes (Signed)
 [facility]    CSN: [id number] Arrival date & time: 07/11/24  1456      History   Chief Complaint Chief Complaint  Patient presents with   Pruritis    HPI Paul Beasley is a 33 y.o. male.  has a past medical history of Migraine and Testicular torsion.   HPI Discussed the use of AI scribe software for clinical note transcription with the patient, who gave verbal consent to proceed. He presents with itchy bumps near the groin and rashes on the body.  He has experienced itchy bumps near the groin area for about a week. These bumps are not painful. He is concerned about the possibility of sexually transmitted diseases, including herpes.  In addition to the groin, bumps have appeared on other parts of his body, including a small patch on the wrist and elbows, but these do not itch. He has not been treating the bumps with any medication, only taking showers, and considered changing soap as a potential cause.  He denies any recent STD testing but plans to undergo testing in a week or two. He has been working out frequently and notes that he often does not shower immediately after exercising, which he acknowledges might contribute to the symptoms. He has been using a new soap, which he suspects might be causing the rash, but has not yet stopped using it.  No pain upon scratching the bumps and no rash on the trunk or back.    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   Elevated lactic acid level 07/02/2016   AKI (acute kidney injury) 07/02/2016   Sepsis (HCC) 07/02/2016   Tinea versicolor 06/21/2016   URI (upper respiratory infection) 11/21/2015   Routine general medical examination at a health care facility 01/22/2015     Past Surgical History:  Procedure Laterality Date   ORCHIECTOMY Bilateral 06/24/2016   Procedure: SCROTAL EXPLORATION, RIGHT ORCHIOPEXY, LEFT ORCHIECTOMY;  Surgeon: Garnette Shack, MD;  Location: [facility];  Service: Urology;  Laterality: Bilateral;   WISDOM TOOTH EXTRACTION         Home Medications    Prior to Admission medications   Medication Sig Start Date End Date Taking? Authorizing Provider  nystatin  cream (MYCOSTATIN ) Apply to affected area 2 times daily 07/11/24  Yes Stefan Markarian E, PA-C  triamcinolone  cream (KENALOG ) 0.1 % Apply 1 Application topically 2 (two) times daily. 07/11/24  Yes Daniela Siebers E, PA-C  cabotegravir  ER (APRETUDE ) 600 MG/3ML injection Inject 3 mLs (600 mg total) into the muscle every 30 (thirty) days. 04/24/23   Kuppelweiser, Cassie L, RPH-CPP  cabotegravir  ER (APRETUDE ) 600 MG/3ML injection Inject 3 mLs (600 mg total) into the muscle every 2 (two) months. 05/13/24   Kuppelweiser, Cassie L, RPH-CPP  doxycycline  (VIBRA -TABS) 100 MG tablet Take 2 tablets (200 mg) by mouth within 24-72 hours after condomless sex. If you have sex again within 24 hours of taking doxycycline , take another dose 24 hours after your last dose. 03/19/24   Kuppelweiser, Cassie L, RPH-CPP    Family History Family History  Problem Relation Age of Onset   Hypertension Mother    Hypertension Paternal Grandmother    Hypertension Paternal Grandfather    Healthy Sister  Social History Social History   Tobacco Use   Smoking status: Never   Smokeless tobacco: Never  Vaping Use   Vaping status: Never Used  Substance Use Topics   Alcohol use: Yes    Alcohol/week: 0.0 standard drinks of alcohol    Comment: occ   Drug use: No     Allergies   Patient has no known allergies.   Review of Systems Review of Systems  Skin:  Positive for rash.     Physical Exam Triage Vital Signs ED Triage Vitals  Encounter Vitals Group     BP 07/11/24 1525 129/80     Girls Systolic BP  Percentile --      Girls Diastolic BP Percentile --      Boys Systolic BP Percentile --      Boys Diastolic BP Percentile --      Pulse Rate 07/11/24 1525 77     Resp 07/11/24 1525 16     Temp 07/11/24 1525 98.2 F (36.8 C)     Temp Source 07/11/24 1525 Oral     SpO2 07/11/24 1525 96 %     Weight 07/11/24 1527 165 lb (74.8 kg)     Height 07/11/24 1527 5' 10 (1.778 m)     Head Circumference --      Peak Flow --      Pain Score 07/11/24 1526 0     Pain Loc --      Pain Education --      Exclude from Growth Chart --    No data found.  Updated Vital Signs BP 129/80 (BP Location: Right Arm)   Pulse 77   Temp 98.2 F (36.8 C) (Oral)   Resp 16   Ht 5' 10 (1.778 m)   Wt 165 lb (74.8 kg)   SpO2 96%   BMI 23.68 kg/m   Visual Acuity Right Eye Distance:   Left Eye Distance:   Bilateral Distance:    Right Eye Near:   Left Eye Near:    Bilateral Near:     Physical Exam Vitals reviewed.  Constitutional:      General: He is awake.     Appearance: Normal appearance. He is well-developed and well-groomed.  HENT:     Head: Normocephalic and atraumatic.  Eyes:     Extraocular Movements: Extraocular movements intact.     Conjunctiva/sclera: Conjunctivae normal.  Pulmonary:     Effort: Pulmonary effort is normal.  Genitourinary:    Penis: Normal and circumcised.      Tanner stage (genital): 5.       Comments: Pt declines chaperone for HSV swab Musculoskeletal:     Cervical back: Normal range of motion.  Skin:    Comments: Pt has several small (<3 mm diameter) papular lesions along the  left wrist and a few along the elbows   Neurological:     Mental Status: He is alert and oriented to person, place, and time.  Psychiatric:        Attention and Perception: Attention normal.        Mood and Affect: Mood normal.        Speech: Speech normal.        Behavior: Behavior normal. Behavior is cooperative.      UC Treatments / Results  Labs (all labs ordered are  listed, but only abnormal results are displayed) Labs Reviewed  HSV 1/2 PCR (SURFACE)    EKG   Radiology No results found.  Procedures Procedures (  including critical care time)  Medications Ordered in UC Medications - No data to display  Initial Impression / Assessment and Plan / UC Course  I have reviewed the triage vital signs and the nursing notes.  Pertinent labs & imaging results that were available during my care of the patient were reviewed by me and considered in my medical decision making (see chart for details).      Final Clinical Impressions(s) / UC Diagnoses   Final diagnoses:  Rash and nonspecific skin eruption  Genital lesion, male    Genital pruritus and pruritic papular eruption of trunk/upper body Pruritic papular eruption in the groin area with additional non-pruritic papules on the trunk and upper body for approximately one week. Suspected reaction to soap. Yeast infections are more prone in moist areas and can be itchy and uncomfortable. - Prescribe nystatin  cream for application to the groin area twice daily. - Prescribe a steroid cream for use on the papules on the trunk and upper body, avoiding the face, axillae, and groin.  Fordyce spots Lesions in the genital area consistent with Fordyce spots, which are benign and not indicative of any disease process. Will swab for HSV to help with rule out and provide pt education materials for further review.     Discharge Instructions      VISIT SUMMARY:  You came in today with concerns about itchy bumps near your groin and rashes on other parts of your body. You were worried about the possibility of sexually transmitted diseases and mentioned that you have been using a new soap and not showering immediately after workouts, which might be contributing to your symptoms.  YOUR PLAN:  -GENITAL PRURITUS AND PRURITIC PAPULAR ERUPTION OF TRUNK/UPPER BODY: You have itchy bumps in your groin area and non-itchy  bumps on your trunk and upper body, likely due to a reaction to your new soap. Yeast infections can also occur in moist areas and cause itching. We have prescribed nystatin  cream for your groin area to be applied twice daily and a steroid cream for the bumps on your trunk and upper body, avoiding the face, armpits, and groin.  -FORDYCE SPOTS: The bumps in your genital area are consistent with Fordyce spots, which are harmless and not related to any disease. We have swabbed them for HSV for rule out and will keep up updated on those results once available.   INSTRUCTIONS:  Please follow up with STD testing as planned in a week or two. Apply the nystatin  cream to the groin area twice daily and the steroid cream to the bumps on your trunk and upper body, avoiding the face, armpits, and groin. Consider stopping the use of the new soap to see if your symptoms improve.     ED Prescriptions     Medication Sig Dispense Auth. Provider   triamcinolone  cream (KENALOG ) 0.1 % Apply 1 Application topically 2 (two) times daily. 30 g Anija Brickner E, PA-C   nystatin  cream (MYCOSTATIN ) Apply to affected area 2 times daily 30 g Rand Boller E, PA-C      PDMP not reviewed this encounter.   Marylene Rocky BRAVO, PA-C 07/11/24 1623

## 2024-07-11 NOTE — ED Triage Notes (Signed)
 Pt presents with a chief complaint of itching in groin area x 1 week. Also mentions bumps on bilateral arms. No pain. No medications taken/applied to skin for symptoms reported. Pt shares he did start using a new acne soap two weeks ago. Applied all over body. Unsure if this is contributing to his symptoms.

## 2024-07-12 ENCOUNTER — Telehealth (HOSPITAL_COMMUNITY): Payer: Self-pay | Admitting: Physician Assistant

## 2024-07-12 NOTE — Telephone Encounter (Signed)
 Order for HSV swab placed.

## 2024-07-13 LAB — HSV 1/2 PCR (SURFACE)
HSV-1 DNA: NOT DETECTED
HSV-2 DNA: NOT DETECTED

## 2024-07-19 ENCOUNTER — Other Ambulatory Visit (HOSPITAL_COMMUNITY): Payer: Self-pay

## 2024-07-30 ENCOUNTER — Telehealth: Payer: Self-pay

## 2024-07-30 NOTE — Telephone Encounter (Signed)
 [facility] Patient Advocate Encounter  Patient's medications Apretude  have been couriered to [facility] from [facility] and will be administered at the patients appointment on 08/02/24.  Arland Hutchinson, CPhT Specialty Pharmacy Patient [facility] for Infectious Disease Phone: [phone] Fax:  [phone]

## 2024-07-31 NOTE — Progress Notes (Signed)
 HPI: Paul Beasley is a 33 y.o. male who presents to the [facility] pharmacy clinic for Apretude  administration and HIV PrEP follow up.  Referring ID Physician: Dr. 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   CABOTEGRAVIR  ER (APRETUDE ) 600 MG/3ML INJECTION    Inject 3 mLs (600 mg total) into the muscle every 2 (two) months.   DOXYCYCLINE  (VIBRA -TABS) 100 MG TABLET    Take 2 tablets (200 mg) by mouth within 24-72 hours after condomless sex. If you have sex again within 24 hours of taking doxycycline , take another dose 24 hours after your last dose.   NYSTATIN  CREAM (MYCOSTATIN )    Apply to affected area 2 times daily   TRIAMCINOLONE  CREAM (KENALOG ) 0.1 %    Apply 1 Application topically 2 (two) times daily.  Modified Medications   No medications on file  Discontinued Medications   No medications on file    Allergies: No Known Allergies  Past Medical History: Past Medical History:  Diagnosis Date   Migraine    Testicular torsion     Social History: Social History   Socioeconomic History   Marital status: Single    Spouse name: Not on file   Number of children: Not on file   Years of education: Not on file   Highest education level: Not on file  Occupational History   Occupation: Chief Technology Officer  Tobacco Use   Smoking status: Never    Smokeless tobacco: Never  Vaping Use   Vaping status: Never Used  Substance and Sexual Activity   Alcohol use: Yes    Alcohol/week: 0.0 standard drinks of alcohol    Comment: occ   Drug use: No   Sexual activity: Yes    Partners: Female, Male    Comment: offered condoms  Other Topics Concern   Not on file  Social History Narrative   Not on file   Social Drivers of Health   Financial Resource Strain: Not on file  Food Insecurity: Not on file  Transportation Needs: Not on file  Physical Activity: Not on file  Stress: Not on file  Social Connections: Unknown (03/01/2022)   Received from [facility]   Social Network    Social Network: Not on file    Labs: Lab Results  Component Value Date   HIV1RNAQUANT NOT DETECTED 05/31/2024   HIV1RNAQUANT NOT DETECTED 04/05/2024   HIV1RNAQUANT NOT DETECTED 01/26/2024    RPR and STI Lab Results  Component Value Date   LABRPR NON-REACTIVE 05/31/2024   LABRPR NON-REACTIVE 04/05/2024   LABRPR NON-REACTIVE 01/26/2024   LABRPR NON-REACTIVE 04/19/2023   LABRPR NON-REACTIVE 12/10/2020    STI Results GC CT  04/19/2023  9:11 AM Negative    Negative    Negative  Negative    Negative    Negative   12/10/2020 11:42 AM Negative  Negative    Negative  Negative    Negative    Negative   11/03/2020  4:28 PM Negative  Negative   08/24/2020 11:48 AM Negative  Negative   06/01/2020 11:54 AM Negative    Negative  Negative    Negative   12/03/2019 12:00 PM Negative    Negative  Negative    Negative   06/20/2019 12:00 AM Negative  Negative   05/30/2019 12:00 AM Negative    Negative    Negative  Negative    Negative    Negative   11/30/2018 12:00 AM **POSITIVE**    Negative  Negative    Negative   04/03/2018 12:00 AM Negative    Negative    Negative  Negative    Negative    Negative   01/16/2018  5:00 PM  Negative   12/29/2017  4:37 PM  Negative     Hepatitis B Lab Results  Component Value Date   HEPBSAB REACTIVE (A)  02/27/2019   HEPBSAG NON-REACTIVE 04/03/2018   Hepatitis C No results found for: HEPCAB, HCVRNAPCRQN Hepatitis A Lab Results  Component Value Date   HAV REACTIVE (A) 04/03/2018   Lipids: Lab Results  Component Value Date   CHOL 252 (H) 04/19/2023   TRIG 86 04/19/2023   HDL 47 04/19/2023   CHOLHDL 5.4 (H) 04/19/2023   LDLCALC 185 (H) 04/19/2023    TARGET DATE: The 18th of the month  Assessment: Paul Beasley presents today for their Apretude  injection and to follow up for HIV PrEP. No issues with past injections.  Screened patient for acute HIV symptoms such as fatigue, muscle aches, rash, sore throat, lymphadenopathy, headache, night sweats, nausea/vomiting/diarrhea, and fever. Patient denies any symptoms.   Per [employer] guidelines, a rapid HIV test should be drawn prior to Apretude  administration. Due to state shortage of rapid HIV tests, this is temporarily unable to be done. Per decision from [facility] physicians, we will proceed with Apretude  administration at this time without a negative rapid HIV test beforehand. HIV RNA was collected today and is in process.  Administered cabotegravir  600mg /56mL in right upper outer quadrant of the gluteal muscle. Will make follow up appointments for maintenance injections every 2 months.   Visited UC on 07/11/24 for pruritic bumps in groin area which was likely reaction to new soap he tried; states this has fully resolved now. Last STI screening was in August and was negative. No new partners since last injection; will check RPR and urine/oral cytologies today. Also eligible for flu vaccination today which he will defer until December.  Discussed [place]o for PrEP. Counseled patient that they will need to complete an oral loading dose. Counseled that patient will take two Sunlenca 300 mg tablets (600 mg total) orally on the first day of their injection and will take two Sunlenca 300 mg tablets (600 mg total) orally the next day regardless of meals.  Counseled patient that Sunlenca is two separate subcutaneous injections in the abdomen every 6 months. Reviewed that the main side effects are injection-site soreness and nodules. Discussed measures for relief including cold packs and over-the-counter anti-inflammatories. Would like to continue with Apretude  at this time.   Plan: - Administer Apretude  600 mg x 1  - Maintenance injections scheduled for 12/12 with me  - Check HIV RNA, RPR, and urine/oral cytologies  - Call with any issues or questions  Alan Geralds, PharmD, CPP, BCIDP, AAHIVP Clinical Pharmacist Practitioner Infectious Diseases Clinical Pharmacist [facility]

## 2024-08-02 ENCOUNTER — Ambulatory Visit (INDEPENDENT_AMBULATORY_CARE_PROVIDER_SITE_OTHER): Admitting: Pharmacist

## 2024-08-02 ENCOUNTER — Other Ambulatory Visit: Payer: Self-pay

## 2024-08-02 DIAGNOSIS — Z79899 Other long term (current) drug therapy: Secondary | ICD-10-CM

## 2024-08-02 DIAGNOSIS — Z113 Encounter for screening for infections with a predominantly sexual mode of transmission: Secondary | ICD-10-CM

## 2024-08-02 MED ORDER — CABOTEGRAVIR ER 600 MG/3ML IM SUER
600.0000 mg | Freq: Once | INTRAMUSCULAR | Status: AC
  Administered 2024-08-02: 600 mg via INTRAMUSCULAR

## 2024-08-02 NOTE — Addendum Note (Signed)
 Addended by: GRETEL TULLY HERO on: 08/02/2024 10:19 AM   Modules accepted: Orders

## 2024-08-06 LAB — RPR: RPR Ser Ql: NONREACTIVE

## 2024-08-06 LAB — GC/CHLAMYDIA PROBE, AMP (THROAT)
Chlamydia trachomatis RNA: NOT DETECTED
Neisseria gonorrhoeae RNA: NOT DETECTED

## 2024-08-06 LAB — HIV-1 RNA QUANT-NO REFLEX-BLD
HIV 1 RNA Quant: NOT DETECTED {copies}/mL
HIV-1 RNA Quant, Log: NOT DETECTED {Log_copies}/mL

## 2024-08-08 ENCOUNTER — Other Ambulatory Visit: Payer: Self-pay

## 2024-08-08 ENCOUNTER — Ambulatory Visit
Admission: EM | Admit: 2024-08-08 | Discharge: 2024-08-08 | Disposition: A | Discharge status: Home / Self Care | Attending: Nurse Practitioner | Admitting: Nurse Practitioner

## 2024-08-08 DIAGNOSIS — S29012A Strain of muscle and tendon of back wall of thorax, initial encounter: Secondary | ICD-10-CM

## 2024-08-08 MED ORDER — METHOCARBAMOL 500 MG PO TABS: 500.0000 mg | ORAL_TABLET | Freq: Every morning | ORAL | 0 refills | Status: AC

## 2024-08-08 MED ORDER — KETOROLAC TROMETHAMINE 60 MG/2ML IM SOLN
60.0000 mg | Freq: Once | INTRAMUSCULAR | Status: AC
  Administered 2024-08-08: 60 mg via INTRAMUSCULAR

## 2024-08-08 MED ORDER — CYCLOBENZAPRINE HCL 10 MG PO TABS: 10.0000 mg | ORAL_TABLET | Freq: Every day | ORAL | 0 refills | Status: AC

## 2024-08-08 MED ORDER — NAPROXEN 500 MG PO TABS: 500.0000 mg | ORAL_TABLET | Freq: Two times a day (BID) | ORAL | 0 refills | Status: AC

## 2024-08-08 NOTE — ED Triage Notes (Addendum)
 Pt presents with a chief complaint of lower right-sided back pain that began yesterday morning. Currently rates overall back pain a 7/10. Describes the pain as aching and sharp. Only on the right side. Denies urinary symptoms. No medications taken PTA for symptoms reported. No fevers. No recent injuries/heavy lifting.

## 2024-08-08 NOTE — ED Provider Notes (Signed)
 [facility]    CSN: [id number] Arrival date & time: 08/08/24  1027      History   Chief Complaint Chief Complaint  Patient presents with   Back Pain    HPI Notnamed Scholz is a 33 y.o. male.   Discussed the use of AI scribe software for clinical note transcription with the patient, who gave verbal consent to proceed.   The patient presents with back pain that began yesterday morning. The patient was fine the night prior. The onset was gradual rather than sudden, and no specific precipitating event such as heavy lifting, pulling, moving awkwardly, or sleeping in an unusual position was identified. The pain is described as an ache that becomes sharp with movement. The patient denies any numbness or tingling radiating down the buttocks or legs, leg weakness, or urinary symptoms including pain, burning, or blood in the urine. Heat application was tried but did not provide relief. No anti-inflammatory medications such as ibuprofen  have been tried.  The patient has no prior history of back problems.   The following sections of the patient's history were reviewed and updated as appropriate: allergies, current medications, past family history, past medical history, past social history, past surgical history, and problem list.     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   Elevated lactic acid level 07/02/2016   AKI (acute kidney injury) 07/02/2016   Sepsis (HCC) 07/02/2016   Tinea versicolor 06/21/2016   URI (upper respiratory infection) 11/21/2015   Routine general medical examination at a health care facility 01/22/2015    Past Surgical History:  Procedure Laterality Date   ORCHIECTOMY Bilateral 06/24/2016   Procedure:  SCROTAL EXPLORATION, RIGHT ORCHIOPEXY, LEFT ORCHIECTOMY;  Surgeon: Garnette Shack, MD;  Location: [facility];  Service: Urology;  Laterality: Bilateral;   WISDOM TOOTH EXTRACTION         Home Medications    Prior to Admission medications   Medication Sig Start Date End Date Taking? Authorizing Provider  cyclobenzaprine (FLEXERIL) 10 MG tablet Take 1 tablet (10 mg total) by mouth at bedtime. 08/08/24  Yes Iola Lukes, FNP  methocarbamol (ROBAXIN) 500 MG tablet Take 1 tablet (500 mg total) by mouth every morning. 08/08/24  Yes Iola Lukes, FNP  naproxen (NAPROSYN) 500 MG tablet Take 1 tablet (500 mg total) by mouth 2 (two) times daily with a meal. Take with food to avoid stomach upset. Do not take any additional NSAIDs while on this. You may take tylenol  in addition to this if needed for extra pain relief. 08/08/24  Yes Iola Lukes, FNP  cabotegravir  ER (APRETUDE ) 600 MG/3ML injection Inject 3 mLs (600 mg total) into the muscle every 2 (two) months. 05/13/24   Kuppelweiser, Cassie L, RPH-CPP  doxycycline  (VIBRA -TABS) 100 MG tablet Take 2 tablets (200 mg) by mouth within 24-72 hours after condomless sex. If you have sex again within 24 hours of taking doxycycline , take another dose 24 hours after your last dose. 03/19/24   Kuppelweiser, Cassie L, RPH-CPP  nystatin  cream (MYCOSTATIN ) Apply to affected area 2 times daily 07/11/24   Mecum, Erin E, PA-C  triamcinolone  cream (KENALOG ) 0.1 % Apply 1 Application topically 2 (two) times daily. 07/11/24   Mecum, Erin E, PA-C  Family History Family History  Problem Relation Age of Onset   Hypertension Mother    Hypertension Paternal Grandmother    Hypertension Paternal Grandfather    Healthy Sister     Social History Social History   Tobacco Use   Smoking status: Never   Smokeless tobacco: Never  Vaping Use   Vaping status: Never Used  Substance Use Topics   Alcohol use: Yes    Alcohol/week: 0.0 standard drinks of alcohol     Comment: occ   Drug use: No     Allergies   Patient has no known allergies.   Review of Systems Review of Systems  Genitourinary:  Negative for decreased urine volume, difficulty urinating, dysuria, frequency and hematuria.  Musculoskeletal:  Positive for back pain (right mid-lower).  Neurological:  Negative for weakness and numbness.  All other systems reviewed and are negative.    Physical Exam Triage Vital Signs ED Triage Vitals  Encounter Vitals Group     BP 08/08/24 1053 (!) 146/78     Girls Systolic BP Percentile --      Girls Diastolic BP Percentile --      Boys Systolic BP Percentile --      Boys Diastolic BP Percentile --      Pulse Rate 08/08/24 1053 78     Resp 08/08/24 1053 17     Temp 08/08/24 1053 98.1 F (36.7 C)     Temp Source 08/08/24 1053 Oral     SpO2 08/08/24 1053 97 %     Weight 08/08/24 1055 164 lb 14.5 oz (74.8 kg)     Height 08/08/24 1055 5' 10 (1.778 m)     Head Circumference --      Peak Flow --      Pain Score 08/08/24 1055 7     Pain Loc --      Pain Education --      Exclude from Growth Chart --    No data found.  Updated Vital Signs BP (!) 146/78 (BP Location: Right Arm)   Pulse 78   Temp 98.1 F (36.7 C) (Oral)   Resp 17   Ht 5' 10 (1.778 m)   Wt 164 lb 14.5 oz (74.8 kg)   SpO2 97%   BMI 23.66 kg/m   Visual Acuity Right Eye Distance:   Left Eye Distance:   Bilateral Distance:    Right Eye Near:   Left Eye Near:    Bilateral Near:     Physical Exam Vitals reviewed.  Constitutional:      General: He is not in acute distress.    Appearance: Normal appearance. He is not ill-appearing, toxic-appearing or diaphoretic.  HENT:     Head: Normocephalic.     Mouth/Throat:     Mouth: Mucous membranes are moist.  Cardiovascular:     Rate and Rhythm: Normal rate and regular rhythm.  Pulmonary:     Effort: Pulmonary effort is normal.     Breath sounds: Normal breath sounds.  Abdominal:     Palpations: Abdomen is soft.      Tenderness: There is no right CVA tenderness or left CVA tenderness.  Musculoskeletal:        General: Normal range of motion.     Cervical back: Normal, normal range of motion and neck supple.     Thoracic back: Normal.     Lumbar back: Tenderness present. No swelling, deformity, lacerations or spasms. Normal range of motion. Negative right straight leg raise test  and negative left straight leg raise test.       Back:  Skin:    General: Skin is warm and dry.  Neurological:     General: No focal deficit present.     Mental Status: He is alert and oriented to person, place, and time.     Cranial Nerves: Cranial nerves 2-12 are intact.     Sensory: Sensation is intact.     Motor: Motor function is intact. No weakness.     Coordination: Coordination is intact.     Gait: Gait is intact.  Psychiatric:        Mood and Affect: Mood normal.        Speech: Speech normal.        Behavior: Behavior normal. Behavior is cooperative.      UC Treatments / Results  Labs (all labs ordered are listed, but only abnormal results are displayed) Labs Reviewed - No data to display  EKG   Radiology No results found.  Procedures Procedures (including critical care time)  Medications Ordered in UC Medications  ketorolac  (TORADOL ) injection 60 mg (has no administration in time range)    Initial Impression / Assessment and Plan / UC Course  I have reviewed the triage vital signs and the nursing notes.  Pertinent labs & imaging results that were available during my care of the patient were reviewed by me and considered in my medical decision making (see chart for details).     Patient presents with right paraspinal thoracic pain. No numbness, tingling, or weakness in the buttocks or legs. No recent injury, heavy lifting, or pulling reported. No urinary symptoms or blood in stool. Examination and history are consistent with a back muscle strain. Toradol  was administered intramuscularly for  acute pain and inflammation relief. Naproxen prescribed twice daily, with Robaxin for morning use and Flexeril for nighttime use. Extra-strength acetaminophen  may be taken for additional pain relief, but no other NSAIDs should be used while on naproxen. Advised application of moist heat, avoidance of heavy lifting or strenuous activity, and follow-up with orthopedics in 10 days if symptoms persist. Patient instructed to seek immediate care for new numbness, weakness, bowel or bladder incontinence, severe worsening pain, or fever.  Today's evaluation has revealed no signs of a dangerous process. Discussed diagnosis with patient and/or guardian. Patient and/or guardian aware of their diagnosis, possible red flag symptoms to watch out for and need for close follow up. Patient and/or guardian understands verbal and written discharge instructions. Patient and/or guardian comfortable with plan and disposition.  Patient and/or guardian has a clear mental status at this time, good insight into illness (after discussion and teaching) and has clear judgment to make decisions regarding their care  Documentation was completed with the aid of voice recognition software. Transcription may contain typographical errors.   Final Clinical Impressions(s) / UC Diagnoses   Final diagnoses:  Strain of thoracic back region     Discharge Instructions      You were seen today for pain in the right side of your mid-back. Your symptoms and exam are consistent with a back muscle strain. There were no signs of nerve involvement such as numbness, tingling, or weakness in your legs, and no concerning symptoms such as urinary problems or blood in your stool.  You received a Toradol  injection today to help relieve pain and inflammation. You were prescribed naproxen to take twice a day with food, Robaxin to use in the morning for muscle tightness, and Flexeril  at bedtime to help with nighttime spasms and improve sleep. You may  also take extra-strength Tylenol  if you need additional pain relief, but do not take other anti-inflammatory medicines such as ibuprofen , Motrin , or Aleve while using naproxen. At home, use moist heat on the sore area for 15-20 minutes several times a day to relax the muscles and improve comfort. Avoid heavy lifting, bending, or strenuous activity until you start feeling better. Gentle stretching and light movement, as tolerated, can also help reduce stiffness. You should begin to feel gradual improvement over the next several days. Schedule a follow-up with an orthopedic provider in about 10 days if your symptoms are not improving. Go to the emergency department immediately if you develop new numbness, weakness, loss of control of your bladder or bowels, severe or worsening pain, or fever, as these could be signs of a more serious condition.      ED Prescriptions     Medication Sig Dispense Auth. Provider   cyclobenzaprine (FLEXERIL) 10 MG tablet Take 1 tablet (10 mg total) by mouth at bedtime. 10 tablet Iola Lukes, FNP   methocarbamol (ROBAXIN) 500 MG tablet Take 1 tablet (500 mg total) by mouth every morning. 10 tablet Iola Lukes, FNP   naproxen (NAPROSYN) 500 MG tablet Take 1 tablet (500 mg total) by mouth 2 (two) times daily with a meal. Take with food to avoid stomach upset. Do not take any additional NSAIDs while on this. You may take tylenol  in addition to this if needed for extra pain relief. 20 tablet Iola Lukes, FNP      PDMP not reviewed this encounter.   Iola Lukes, [place] 08/08/24 1208

## 2024-08-08 NOTE — Discharge Instructions (Addendum)
 You were seen today for pain in the right side of your mid-back. Your symptoms and exam are consistent with a back muscle strain. There were no signs of nerve involvement such as numbness, tingling, or weakness in your legs, and no concerning symptoms such as urinary problems or blood in your stool.  You received a Toradol  injection today to help relieve pain and inflammation. You were prescribed naproxen to take twice a day with food, Robaxin to use in the morning for muscle tightness, and Flexeril at bedtime to help with nighttime spasms and improve sleep. You may also take extra-strength Tylenol  if you need additional pain relief, but do not take other anti-inflammatory medicines such as ibuprofen , Motrin , or Aleve while using naproxen. At home, use moist heat on the sore area for 15-20 minutes several times a day to relax the muscles and improve comfort. Avoid heavy lifting, bending, or strenuous activity until you start feeling better. Gentle stretching and light movement, as tolerated, can also help reduce stiffness. You should begin to feel gradual improvement over the next several days. Schedule a follow-up with an orthopedic provider in about 10 days if your symptoms are not improving. Go to the emergency department immediately if you develop new numbness, weakness, loss of control of your bladder or bowels, severe or worsening pain, or fever, as these could be signs of a more serious condition.

## 2024-09-24 NOTE — Progress Notes (Deleted)
 HPI: Paul Beasley is a 33 y.o. male who presents to the [facility] pharmacy clinic for Apretude  administration and HIV PrEP follow up.  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   CYCLOBENZAPRINE  (FLEXERIL ) 10 MG TABLET    Take 1 tablet (10 mg total) by mouth at bedtime.   DOXYCYCLINE  (VIBRA -TABS) 100 MG TABLET    Take 2 tablets (200 mg) by mouth within 24-72 hours after condomless sex. If you have sex again within 24 hours of taking doxycycline , take another dose 24 hours after your last dose.   METHOCARBAMOL  (ROBAXIN ) 500 MG TABLET    Take 1 tablet (500 mg total) by mouth every morning.   NAPROXEN  (NAPROSYN ) 500 MG TABLET    Take 1 tablet (500 mg total) by mouth 2 (two) times daily with a meal. Take with food to avoid stomach upset. Do not take any additional NSAIDs while on this. You may take tylenol  in addition to this if needed for extra pain relief.   NYSTATIN  CREAM (MYCOSTATIN )    Apply to affected area 2 times daily   TRIAMCINOLONE  CREAM (KENALOG ) 0.1 %    Apply 1 Application topically 2 (two) times daily.  Modified Medications   No medications on file  Discontinued Medications   No medications on file    Allergies: No Known Allergies  Past Medical History: Past Medical  History:  Diagnosis Date   Migraine    Testicular torsion     Social History: Social History   Socioeconomic History   Marital status: Single    Spouse name: Not on file   Number of children: Not on file   Years of education: Not on file   Highest education level: Not on file  Occupational History   Occupation: Chief Technology Officer  Tobacco Use   Smoking status: Never   Smokeless tobacco: Never  Vaping Use   Vaping status: Never Used  Substance and Sexual Activity   Alcohol use: Yes    Alcohol/week: 0.0 standard drinks of alcohol    Comment: occ   Drug use: No   Sexual activity: Yes    Partners: Female, Male    Comment: offered condoms  Other Topics Concern   Not on file  Social History Narrative   Not on file   Social Drivers of Health   Financial Resource Strain: Not on file  Food Insecurity: Not on file  Transportation Needs: Not on file  Physical Activity: Not on file  Stress: Not on file  Social Connections: Unknown (03/01/2022)   Received from [facility]   Social Network    Social Network: Not on file    Labs: Lab Results  Component Value Date   HIV1RNAQUANT NOT DETECTED 08/02/2024   HIV1RNAQUANT  NOT DETECTED 05/31/2024   HIV1RNAQUANT NOT DETECTED 04/05/2024    RPR and STI Lab Results  Component Value Date   LABRPR NON-REACTIVE 08/02/2024   LABRPR NON-REACTIVE 05/31/2024   LABRPR NON-REACTIVE 04/05/2024   LABRPR NON-REACTIVE 01/26/2024   LABRPR NON-REACTIVE 04/19/2023    STI Results GC CT  04/19/2023  9:11 AM Negative    Negative    Negative  Negative    Negative    Negative   12/10/2020 11:42 AM Negative    Negative    Negative  Negative    Negative    Negative   11/03/2020  4:28 PM Negative  Negative   08/24/2020 11:48 AM Negative  Negative   06/01/2020 11:54 AM Negative    Negative  Negative    Negative   12/03/2019 12:00 PM Negative    Negative  Negative    Negative   06/20/2019 12:00 AM Negative  Negative   05/30/2019 12:00 AM  Negative    Negative    Negative  Negative    Negative    Negative   11/30/2018 12:00 AM **POSITIVE**    Negative  Negative    Negative   04/03/2018 12:00 AM Negative    Negative    Negative  Negative    Negative    Negative   01/16/2018  5:00 PM  Negative   12/29/2017  4:37 PM  Negative     Hepatitis B Lab Results  Component Value Date   HEPBSAB REACTIVE (A) 02/27/2019   HEPBSAG NON-REACTIVE 04/03/2018   Hepatitis C No results found for: HEPCAB, HCVRNAPCRQN Hepatitis A Lab Results  Component Value Date   HAV REACTIVE (A) 04/03/2018   Lipids: Lab Results  Component Value Date   CHOL 252 (H) 04/19/2023   TRIG 86 04/19/2023   HDL 47 04/19/2023   CHOLHDL 5.4 (H) 04/19/2023   LDLCALC 185 (H) 04/19/2023    TARGET DATE: The 18th of the month  Assessment: Paul Beasley presents today for their Apretude  injection and to follow up for HIV PrEP. No issues with past injections.  Screened patient for acute HIV symptoms such as fatigue, muscle aches, rash, sore throat, lymphadenopathy, headache, night sweats, nausea/vomiting/diarrhea, and fever. Patient denies any symptoms.   Administered cabotegravir  600mg /54mL in *** upper outer quadrant of the gluteal muscle. Will make follow up appointments for maintenance injections every 2 months.   No known exposures to any STIs and no signs or symptoms of any STIs today. Last STI screening was in October and was negative. *** new partners since last injection; will check *** today.   Plan: - Administer Apretude  600 mg x 1  - Maintenance injections scheduled for *** - Check HIV RNA and *** - Call with any issues or questions  Alan Geralds, PharmD, CPP, BCIDP, AAHIVP Clinical Pharmacist Practitioner Infectious Diseases Clinical Pharmacist [facility]

## 2024-09-26 ENCOUNTER — Telehealth: Payer: Self-pay

## 2024-09-26 ENCOUNTER — Other Ambulatory Visit (HOSPITAL_COMMUNITY): Payer: Self-pay

## 2024-09-26 NOTE — Telephone Encounter (Signed)
 [facility] Patient Advocate Encounter  Patient's medications APRETUDE  have been couriered to [facility] from [facility] and will be administered at the patients appointment on 09/27/24.  Charmaine Sharps, CPhT Specialty Pharmacy Patient [facility] Phone: [phone] Fax:  [phone]

## 2024-09-27 ENCOUNTER — Ambulatory Visit: Payer: Self-pay | Admitting: Pharmacist

## 2024-09-27 DIAGNOSIS — Z113 Encounter for screening for infections with a predominantly sexual mode of transmission: Secondary | ICD-10-CM

## 2024-09-27 DIAGNOSIS — Z79899 Other long term (current) drug therapy: Secondary | ICD-10-CM

## 2024-09-30 NOTE — Progress Notes (Unsigned)
 HPI: Paul Beasley is a 33 y.o. male who presents to the [facility] pharmacy clinic for Apretude  administration and HIV PrEP follow up.  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   CYCLOBENZAPRINE  (FLEXERIL ) 10 MG TABLET    Take 1 tablet (10 mg total) by mouth at bedtime.   DOXYCYCLINE  (VIBRA -TABS) 100 MG TABLET    Take 2 tablets (200 mg) by mouth within 24-72 hours after condomless sex. If you have sex again within 24 hours of taking doxycycline , take another dose 24 hours after your last dose.   METHOCARBAMOL  (ROBAXIN ) 500 MG TABLET    Take 1 tablet (500 mg total) by mouth every morning.   NAPROXEN  (NAPROSYN ) 500 MG TABLET    Take 1 tablet (500 mg total) by mouth 2 (two) times daily with a meal. Take with food to avoid stomach upset. Do not take any additional NSAIDs while on this. You may take tylenol  in addition to this if needed for extra pain relief.   NYSTATIN  CREAM (MYCOSTATIN )    Apply to affected area 2 times daily   TRIAMCINOLONE  CREAM (KENALOG ) 0.1 %    Apply 1 Application topically 2 (two) times daily.  Modified Medications   No medications on file  Discontinued Medications   No medications on file    Allergies: No Known Allergies  Past Medical History: Past Medical  History:  Diagnosis Date   Migraine    Testicular torsion     Social History: Social History   Socioeconomic History   Marital status: Single    Spouse name: Not on file   Number of children: Not on file   Years of education: Not on file   Highest education level: Not on file  Occupational History   Occupation: Chief Technology Officer  Tobacco Use   Smoking status: Never   Smokeless tobacco: Never  Vaping Use   Vaping status: Never Used  Substance and Sexual Activity   Alcohol use: Yes    Alcohol/week: 0.0 standard drinks of alcohol    Comment: occ   Drug use: No   Sexual activity: Yes    Partners: Female, Male    Comment: offered condoms  Other Topics Concern   Not on file  Social History Narrative   Not on file   Social Drivers of Health   Tobacco Use: Low Risk (08/08/2024)   Patient History    Smoking Tobacco Use: Never    Smokeless Tobacco Use: Never    Passive Exposure: Not on file  Financial Resource Strain: Not on file  Food Insecurity: Not on file  Transportation Needs: Not on file  Physical Activity: Not on file  Stress: Not on file  Social Connections: Unknown (03/01/2022)   Received from [facility]   Social Network    Social Network: Not on file  Depression (PHQ2-9): Not on file  Alcohol Screen: Not on file  Housing: Not on file  Utilities: Not on file  Health Literacy: Not on file    Labs: Lab Results  Component Value Date   HIV1RNAQUANT NOT DETECTED 08/02/2024   HIV1RNAQUANT NOT DETECTED 05/31/2024   HIV1RNAQUANT NOT DETECTED 04/05/2024    RPR and STI Lab Results  Component Value Date   LABRPR NON-REACTIVE 08/02/2024   LABRPR NON-REACTIVE 05/31/2024   LABRPR NON-REACTIVE 04/05/2024   LABRPR NON-REACTIVE 01/26/2024   LABRPR NON-REACTIVE 04/19/2023    STI Results GC CT  04/19/2023  9:11 AM Negative    Negative    Negative  Negative    Negative    Negative   12/10/2020 11:42 AM Negative    Negative    Negative  Negative    Negative     Negative   11/03/2020  4:28 PM Negative  Negative   08/24/2020 11:48 AM Negative  Negative   06/01/2020 11:54 AM Negative    Negative  Negative    Negative   12/03/2019 12:00 PM Negative    Negative  Negative    Negative   06/20/2019 12:00 AM Negative  Negative   05/30/2019 12:00 AM Negative    Negative    Negative  Negative    Negative    Negative   11/30/2018 12:00 AM **POSITIVE**    Negative  Negative    Negative   04/03/2018 12:00 AM Negative    Negative    Negative  Negative    Negative    Negative   01/16/2018  5:00 PM  Negative   12/29/2017  4:37 PM  Negative     Hepatitis B Lab Results  Component Value Date   HEPBSAB REACTIVE (A) 02/27/2019   HEPBSAG NON-REACTIVE 04/03/2018   Hepatitis C No results found for: HEPCAB, HCVRNAPCRQN Hepatitis A Lab Results  Component Value Date   HAV REACTIVE (A) 04/03/2018   Lipids: Lab Results  Component Value Date   CHOL 252 (H) 04/19/2023   TRIG 86 04/19/2023   HDL 47 04/19/2023   CHOLHDL 5.4 (H) 04/19/2023   LDLCALC 185 (H) 04/19/2023    TARGET DATE: The 18th of the month  Assessment: Paul Beasley presents today for their Apretude  injection and to follow up for HIV PrEP. No issues with past injections.  Screened patient for acute HIV symptoms such as fatigue, muscle aches, rash, sore throat, lymphadenopathy, headache, night sweats, nausea/vomiting/diarrhea, and fever. Patient denies any symptoms.   Administered cabotegravir  600mg /49mL in *** upper outer quadrant of the gluteal muscle. Will make follow up appointments for maintenance injections every 2 months.   No known exposures to any STIs and no signs or symptoms of any STIs today. Last STI screening was in October and was negative. *** new partners since last injection; will check *** today.   Plan: - Administer Apretude  600 mg x 1  - Maintenance injections scheduled for *** - Check HIV RNA and *** - Call with any issues or questions  Alan Geralds, PharmD,  CPP, BCIDP, AAHIVP Clinical Pharmacist Practitioner Infectious Diseases Clinical Pharmacist [facility]

## 2024-10-04 ENCOUNTER — Ambulatory Visit: Admitting: Pharmacist

## 2024-10-04 ENCOUNTER — Other Ambulatory Visit: Payer: Self-pay

## 2024-10-04 DIAGNOSIS — Z23 Encounter for immunization: Secondary | ICD-10-CM | POA: Diagnosis not present

## 2024-10-04 DIAGNOSIS — Z79899 Other long term (current) drug therapy: Secondary | ICD-10-CM

## 2024-10-04 DIAGNOSIS — Z113 Encounter for screening for infections with a predominantly sexual mode of transmission: Secondary | ICD-10-CM

## 2024-10-04 DIAGNOSIS — Z2981 Encounter for HIV pre-exposure prophylaxis: Secondary | ICD-10-CM | POA: Diagnosis not present

## 2024-10-04 MED ORDER — CABOTEGRAVIR ER 600 MG/3ML IM SUER
600.0000 mg | Freq: Once | INTRAMUSCULAR | Status: AC
  Administered 2024-10-04: 600 mg via INTRAMUSCULAR

## 2024-10-05 LAB — GC/CHLAMYDIA PROBE, AMP (THROAT)
Chlamydia trachomatis RNA: NOT DETECTED
Neisseria gonorrhoeae RNA: NOT DETECTED

## 2024-10-05 LAB — C. TRACHOMATIS/N. GONORRHOEAE RNA
C. trachomatis RNA, TMA: NOT DETECTED
N. gonorrhoeae RNA, TMA: NOT DETECTED

## 2024-10-05 LAB — CT/NG RNA, TMA RECTAL
Chlamydia Trachomatis RNA: NOT DETECTED
Neisseria Gonorrhoeae RNA: NOT DETECTED

## 2024-10-07 LAB — HIV-1 RNA QUANT-NO REFLEX-BLD
HIV 1 RNA Quant: NOT DETECTED {copies}/mL
HIV-1 RNA Quant, Log: NOT DETECTED {Log_copies}/mL

## 2024-10-07 LAB — SYPHILIS: RPR W/REFLEX TO RPR TITER AND TREPONEMAL ANTIBODIES, TRADITIONAL SCREENING AND DIAGNOSIS ALGORITHM: RPR Ser Ql: NONREACTIVE

## 2024-12-06 ENCOUNTER — Ambulatory Visit: Payer: Self-pay | Admitting: Pharmacist
